# Patient Record
Sex: Male | Born: 1935 | Race: White | Hispanic: No | Marital: Married | Smoking: Never smoker
Health system: Southern US, Community
[De-identification: ages and names within clinical notes are randomized; demographics above are authoritative.]

## PROBLEM LIST (undated history)

## (undated) DIAGNOSIS — I340 Nonrheumatic mitral (valve) insufficiency: Secondary | ICD-10-CM

## (undated) DIAGNOSIS — G43909 Migraine, unspecified, not intractable, without status migrainosus: Secondary | ICD-10-CM

## (undated) DIAGNOSIS — E785 Hyperlipidemia, unspecified: Secondary | ICD-10-CM

## (undated) DIAGNOSIS — R609 Edema, unspecified: Secondary | ICD-10-CM

## (undated) DIAGNOSIS — R600 Localized edema: Secondary | ICD-10-CM

## (undated) DIAGNOSIS — I509 Heart failure, unspecified: Secondary | ICD-10-CM

## (undated) DIAGNOSIS — N289 Disorder of kidney and ureter, unspecified: Secondary | ICD-10-CM

## (undated) DIAGNOSIS — Z9889 Other specified postprocedural states: Secondary | ICD-10-CM

## (undated) DIAGNOSIS — S82141A Displaced bicondylar fracture of right tibia, initial encounter for closed fracture: Secondary | ICD-10-CM

## (undated) DIAGNOSIS — I10 Essential (primary) hypertension: Secondary | ICD-10-CM

## (undated) DIAGNOSIS — I350 Nonrheumatic aortic (valve) stenosis: Secondary | ICD-10-CM

## (undated) DIAGNOSIS — I6521 Occlusion and stenosis of right carotid artery: Secondary | ICD-10-CM

## (undated) DIAGNOSIS — E119 Type 2 diabetes mellitus without complications: Secondary | ICD-10-CM

## (undated) DIAGNOSIS — R0600 Dyspnea, unspecified: Secondary | ICD-10-CM

## (undated) HISTORY — PX: OTHER SURGICAL HISTORY: SHX169

## (undated) HISTORY — PX: CARDIAC SURGERY: SHX584

## (undated) HISTORY — PX: JOINT REPLACEMENT: SHX530

---

## 2008-02-18 ENCOUNTER — Ambulatory Visit: Payer: Self-pay | Admitting: Family Medicine

## 2009-01-29 ENCOUNTER — Ambulatory Visit: Payer: Self-pay | Admitting: Cardiology

## 2009-02-02 ENCOUNTER — Ambulatory Visit: Payer: Self-pay | Admitting: Cardiology

## 2010-04-27 ENCOUNTER — Ambulatory Visit: Payer: Self-pay | Admitting: Vascular Surgery

## 2010-06-22 ENCOUNTER — Ambulatory Visit: Payer: Self-pay | Admitting: Cardiology

## 2010-07-20 ENCOUNTER — Ambulatory Visit: Payer: Self-pay | Admitting: Vascular Surgery

## 2010-07-28 ENCOUNTER — Inpatient Hospital Stay: Payer: Self-pay | Admitting: Vascular Surgery

## 2011-06-19 ENCOUNTER — Emergency Department: Payer: Self-pay | Admitting: Emergency Medicine

## 2011-06-19 LAB — BASIC METABOLIC PANEL
Anion Gap: 9 (ref 7–16)
Calcium, Total: 8.6 mg/dL (ref 8.5–10.1)
Chloride: 103 mmol/L (ref 98–107)
Co2: 28 mmol/L (ref 21–32)
EGFR (African American): >60
EGFR (Non-African Amer.): >60
Osmolality: 278 (ref 275–301)
Potassium: 3.5 mmol/L (ref 3.5–5.1)
Sodium: 140 mmol/L (ref 136–145)

## 2011-06-19 LAB — CBC
HCT: 37.3 % — ABNORMAL LOW (ref 40.0–52.0)
HGB: 12.5 g/dL — ABNORMAL LOW (ref 13.0–18.0)
MCH: 29.5 pg (ref 26.0–34.0)
MCV: 88 fL (ref 80–100)
Platelet: 169 10*3/uL (ref 150–440)
WBC: 7.2 10*3/uL (ref 3.8–10.6)

## 2011-06-19 LAB — TROPONIN I: Troponin-I: <0.02 ng/mL

## 2011-10-27 ENCOUNTER — Ambulatory Visit: Payer: Self-pay | Admitting: Family Medicine

## 2011-11-16 ENCOUNTER — Ambulatory Visit: Payer: Self-pay | Admitting: Surgery

## 2011-11-16 LAB — HEPATIC FUNCTION PANEL A (ARMC)
Albumin: 2.6 g/dL — ABNORMAL LOW (ref 3.4–5.0)
Alkaline Phosphatase: 294 U/L — ABNORMAL HIGH (ref 50–136)
Bilirubin, Direct: 0.1 mg/dL (ref 0.00–0.20)
SGOT(AST): 20 U/L (ref 15–37)
SGPT (ALT): 24 U/L (ref 12–78)

## 2011-11-16 LAB — CBC
HCT: 31.9 % — ABNORMAL LOW (ref 40.0–52.0)
HGB: 10.7 g/dL — ABNORMAL LOW (ref 13.0–18.0)
MCHC: 33.5 g/dL (ref 32.0–36.0)
Platelet: 217 10*3/uL (ref 150–440)
RDW: 15.2 % — ABNORMAL HIGH (ref 11.5–14.5)
WBC: 5 10*3/uL (ref 3.8–10.6)

## 2011-11-23 ENCOUNTER — Ambulatory Visit: Payer: Self-pay | Admitting: Surgery

## 2011-11-25 LAB — PATHOLOGY REPORT

## 2012-10-29 ENCOUNTER — Ambulatory Visit: Payer: Self-pay | Admitting: Family Medicine

## 2012-11-03 LAB — CULTURE, BLOOD (SINGLE)

## 2013-03-07 ENCOUNTER — Ambulatory Visit: Payer: Self-pay | Admitting: Family Medicine

## 2013-05-20 ENCOUNTER — Ambulatory Visit: Payer: Self-pay | Admitting: Vascular Surgery

## 2013-08-25 ENCOUNTER — Ambulatory Visit: Payer: Self-pay | Admitting: Family Medicine

## 2014-07-08 NOTE — Op Note (Signed)
PATIENT NAME:  Lawrence Aguirre, Lawrence Aguirre MR#:  [id number] DATE OF BIRTH:  10/23/1924  DATE OF PROCEDURE:  11/23/2011  PREOPERATIVE DIAGNOSIS: Chronic cholecystitis, cholelithiasis.   POSTOPERATIVE DIAGNOSIS: Chronic cholecystitis, cholelithiasis.  PROCEDURE: Laparoscopic cholecystectomy.   SURGEON: Adella Hare, MD    ANESTHESIA: General.   INDICATION: This 81 year old male had a recent history of severe right upper quadrant pain. He had CT findings of gallstones. He had elevated alkaline phosphatase. He had ERCP and choledocholithotomy which was done in [facility]. Surgery was recommended for definitive treatment.   DESCRIPTION OF PROCEDURE: The patient was placed on the operating table in the supine position under general endotracheal anesthesia. The abdomen was prepared with ChloraPrep and draped in a sterile manner.   A short incision was made in the inferior aspect of the umbilicus and carried down to the deep fascia which was grasped with laryngeal hook and elevated. A Veress needle was inserted, aspirated, and irrigated with a saline solution. Next, the peritoneal cavity was inflated with carbon dioxide. The Veress needle was removed. The 10 mm cannula was inserted. The 10 mm 0 degree laparoscope was inserted to view the peritoneal cavity. The liver appeared normal. Intestines which were viewed appeared typical. Another incision was made in the epigastrium slightly to the right of the midline to introduce an 11 mm cannula. Two incisions were made in the lateral aspect of the right upper quadrant to introduce two 5-mm cannulas.   The gallbladder appeared to have a somewhat thickened wall and was retracted towards the right shoulder. Multiple adhesions were taken down with blunt and sharp dissection. The infundibulum was retracted inferiorly and laterally. The porta hepatis was demonstrated. The neck of the gallbladder was mobilized with incision of the visceral peritoneum. The cystic duct was  dissected free from surrounding structures. The cystic artery was dissected free from surrounding structures. A critical view of safety was demonstrated. An Endoclip was placed across the cystic duct adjacent to the neck of the gallbladder. An incision was made in the cystic duct to introduce a Reddick catheter, however, the catheter would not thread into the cystic duct and, therefore, it was removed and a cholangiogram was not done. The cystic duct was doubly ligated with endoclips and divided. The cystic artery was controlled with Endoclip and divided and another branch of the cystic artery was controlled with Endoclip and divided. The gallbladder was dissected free from the liver with hook and cautery. There were several stones which fell out of the gallbladder and were retrieved with the stone scoop. The gallbladder was further dissected and was completely separated from the liver. The gallbladder was placed into an EndoCatch bag and brought up through the infraumbilical port site where the skin was incised another 6 mm and the fascial defect was incised another 6 mm and the gallbladder with stones was removed and submitted in formalin for routine pathology. The right upper quadrant was further inspected, irrigated, and aspirated. Hemostasis appeared to be intact. The cannulas were removed. Carbon dioxide was allowed to escape from the peritoneal cavity. The fascial defect at the umbilicus was closed with a 0 Vicryl figure-of-eight suture. The skin incisions were closed with interrupted 5-0 chromic subcuticular sutures, benzoin, and Steri-Strips. Dressings were applied with paper tape. The patient tolerated surgery satisfactorily and was then prepared for transfer to the recovery room.   ____________________________ Shela Commons. Renda Rolls, MD jws:drc D: 11/23/2011 10:51:29 ET T: 11/23/2011 11:27:43 ET JOB#: [id number]  cc: Adella Hare, MD, <Dictator> Lawrence Aguirre  Lawrence ArchJ Quincee Gittens MD ELECTRONICALLY SIGNED 11/23/2011 12:16

## 2014-07-28 ENCOUNTER — Other Ambulatory Visit: Payer: Self-pay | Admitting: Family Medicine

## 2014-07-28 DIAGNOSIS — M7989 Other specified soft tissue disorders: Secondary | ICD-10-CM

## 2014-07-28 DIAGNOSIS — M79604 Pain in right leg: Secondary | ICD-10-CM

## 2014-07-29 ENCOUNTER — Ambulatory Visit
Admission: RE | Admit: 2014-07-29 | Discharge: 2014-07-29 | Disposition: A | Discharge status: Home / Self Care | Payer: Medicare Other | Source: Ambulatory Visit | Attending: Family Medicine | Admitting: Family Medicine

## 2014-07-29 DIAGNOSIS — M79661 Pain in right lower leg: Secondary | ICD-10-CM | POA: Insufficient documentation

## 2014-07-29 DIAGNOSIS — M7989 Other specified soft tissue disorders: Secondary | ICD-10-CM

## 2014-07-29 DIAGNOSIS — M79604 Pain in right leg: Secondary | ICD-10-CM

## 2016-02-02 ENCOUNTER — Other Ambulatory Visit: Payer: Self-pay | Admitting: Family Medicine

## 2016-02-02 DIAGNOSIS — N644 Mastodynia: Secondary | ICD-10-CM

## 2016-02-19 ENCOUNTER — Other Ambulatory Visit: Payer: Self-pay | Admitting: Family Medicine

## 2016-02-19 ENCOUNTER — Ambulatory Visit
Admission: RE | Admit: 2016-02-19 | Discharge: 2016-02-19 | Disposition: A | Discharge status: Home / Self Care | Payer: Medicare Other | Source: Ambulatory Visit | Attending: Family Medicine | Admitting: Family Medicine

## 2016-02-19 DIAGNOSIS — N62 Hypertrophy of breast: Secondary | ICD-10-CM | POA: Diagnosis not present

## 2016-02-19 DIAGNOSIS — N644 Mastodynia: Secondary | ICD-10-CM

## 2017-12-14 ENCOUNTER — Ambulatory Visit
Admission: EM | Admit: 2017-12-14 | Discharge: 2017-12-14 | Disposition: A | Discharge status: Other Acute Inpatient Hospital | Payer: Medicare Other | Attending: Internal Medicine | Admitting: Internal Medicine

## 2017-12-14 ENCOUNTER — Ambulatory Visit (INDEPENDENT_AMBULATORY_CARE_PROVIDER_SITE_OTHER): Payer: Medicare Other

## 2017-12-14 DIAGNOSIS — M25572 Pain in left ankle and joints of left foot: Secondary | ICD-10-CM

## 2017-12-14 DIAGNOSIS — W19XXXA Unspecified fall, initial encounter: Secondary | ICD-10-CM | POA: Diagnosis not present

## 2017-12-14 DIAGNOSIS — S82392A Other fracture of lower end of left tibia, initial encounter for closed fracture: Secondary | ICD-10-CM

## 2017-12-14 HISTORY — DX: Essential (primary) hypertension: I10

## 2017-12-14 MED ORDER — ACETAMINOPHEN 325 MG PO TABS
650.0000 mg | ORAL_TABLET | Freq: Once | ORAL | Status: AC
Start: 2017-12-14 — End: 2017-12-14
  Administered 2017-12-14: 650 mg via ORAL

## 2017-12-14 NOTE — ED Notes (Signed)
Pt in treatment room.  In NAD.  Needs address.  Pt/Fam updated on POC.   

## 2017-12-14 NOTE — ED Triage Notes (Signed)
Left ankle s/p fall around 1330. Was outside next to truck. Mechanical fall only.  "legs just gave out".   Left ankle more swollen than right.

## 2017-12-14 NOTE — ED Provider Notes (Addendum)
[facility]    CSN: [id number] Arrival date & time: 12/14/17  1737     History   Chief Complaint Chief Complaint  Patient presents with  . Ankle Pain  . Fall    HPI Vershawn I Meche Montez Hageman. is a 84 y.o. male.   HPI  84 year old male brought in by his son after he fell he was walking next to a truck.  His legs "just gave out".Denies  Loss of consciousness. Falling  has been a problem of his recently.  Had a tibial plateau fracture on the right knee last year.  This time his left ankle has swelling and ecchymosis.  Is unable to walk on the ankle.     Past Medical History:  Diagnosis Date  . Hypertension     There are no active problems to display for this patient.   Past Surgical History:  Procedure Laterality Date  . JOINT REPLACEMENT         Home Medications    Prior to Admission medications   Medication Sig Start Date End Date Taking? Authorizing Provider  lisinopril (PRINIVIL,ZESTRIL) 5 MG tablet Take 5 mg by mouth 2 (two) times daily.   Yes [provider]  tamsulosin (FLOMAX) 0.4 MG CAPS capsule Take 0.4 mg by mouth 2 (two) times daily.   Yes [provider]    Family History History reviewed. No pertinent family history.  Social History Social History   Tobacco Use  . Smoking status: Never Smoker  Substance Use Topics  . Alcohol use: Never    Frequency: Never  . Drug use: Never     Allergies   Iodine; Nylon; Penicillins; Sulfa antibiotics; and Tape   Review of Systems Review of Systems  Constitutional: Positive for activity change. Negative for appetite change, chills, fatigue and fever.  Musculoskeletal: Positive for gait problem and joint swelling.  All other systems reviewed and are negative.    Physical Exam Triage Vital Signs ED Triage Vitals  Enc Vitals Group     BP 12/14/17 1750 (!) 150/84     Pulse Rate 12/14/17 1750 (!) 103     Resp 12/14/17 1750 18     Temp 12/14/17 1750 97.9 F (36.6 C)     Temp  Source 12/14/17 1750 Oral     SpO2 12/14/17 1750 94 %     Weight --      Height --      Head Circumference --      Peak Flow --      Pain Score 12/14/17 1753 6     Pain Loc --      Pain Edu? --      Excl. in [place]? --    No data found.  Updated Vital Signs BP (!) 150/84 (BP Location: Left Arm)   Pulse (!) 103   Temp 97.9 F (36.6 C) (Oral)   Resp 18   SpO2 94%   Visual Acuity Right Eye Distance:   Left Eye Distance:   Bilateral Distance:    Right Eye Near:   Left Eye Near:    Bilateral Near:     Physical Exam  Constitutional: He is oriented to person, place, and time. He appears well-developed and well-nourished. No distress.  HENT:  Head: Normocephalic.  Patient is hard of hearing  Eyes: Pupils are equal, round, and reactive to light. Right eye exhibits no discharge. Left eye exhibits no discharge.  Neck: Normal range of motion.  Musculoskeletal: He exhibits edema and  tenderness.  Of the left lower extremity shows ecchymosis and swelling from approximately the juncture of the distal and mid thirds.  Tenderness is maximal over the distal tibia.  Neurological: He is alert and oriented to person, place, and time.  Skin: Skin is warm and dry. He is not diaphoretic.  Psychiatric: He has a normal mood and affect. His behavior is normal. Judgment and thought content normal.  Nursing note and vitals reviewed.    UC Treatments / Results  Labs (all labs ordered are listed, but only abnormal results are displayed) Labs Reviewed - No data to display  EKG None  Radiology Dg Ankle Complete Left  Result Date: 12/14/2017 CLINICAL DATA:  Fall, left ankle injury, pain EXAM: LEFT ANKLE COMPLETE - 3+ VIEW COMPARISON:  None. FINDINGS: Distal tibial fracture noted posteriorly and medially. No visible fibular fracture. Diffuse soft tissue swelling. IMPRESSION: Distal tibial fracture noted posteriorly and medially. Electronically Signed   By: Charlett Nose M.D.   On: 12/14/2017 18:32     Procedures Procedures (including critical care time)  Medications Ordered in UC Medications  acetaminophen (TYLENOL) tablet 650 mg (650 mg Oral Given 12/14/17 1848)    Initial Impression / Assessment and Plan / UC Course  I have reviewed the triage vital signs and the nursing notes.  Pertinent labs & imaging results that were available during my care of the patient were reviewed by me and considered in my medical decision making (see chart for details).     Because of the patient's age and his his injury to his right knee  Which still has a brace applied, I recommended that they be seen at the emergency room tonight for evaluation by orthopedic.  Placed him in a posterior splint for nonweightbearing.  His son will take him to [facility] privately owned vehicle.  Had his surgery on his tibial plateau fracture at [facility].  Facility in stable condition.  Even Tylenol 650 mg p.o. prior to his departure Final Clinical Impressions(s) / UC Diagnoses   Final diagnoses:  Other closed fracture of distal end of left tibia, initial encounter   Discharge Instructions   None    ED Prescriptions    None     Controlled Substance Prescriptions  Controlled Substance Registry consulted? Not Applicable   Lutricia Feil, PA-C 12/14/17 2058    Lutricia Feil, PA-C 12/14/17 2100

## 2018-01-23 ENCOUNTER — Other Ambulatory Visit: Payer: Self-pay | Admitting: Family Medicine

## 2018-01-23 ENCOUNTER — Encounter
Admission: RE | Admit: 2018-01-23 | Discharge: 2018-01-23 | Disposition: A | Discharge status: Home / Self Care | Payer: Medicare Other | Source: Ambulatory Visit | Attending: Family Medicine | Admitting: Family Medicine

## 2018-01-23 ENCOUNTER — Other Ambulatory Visit
Admission: RE | Admit: 2018-01-23 | Discharge: 2018-01-23 | Disposition: A | Discharge status: Home / Self Care | Payer: Medicare Other | Source: Ambulatory Visit | Attending: Family Medicine | Admitting: Family Medicine

## 2018-01-23 DIAGNOSIS — R0602 Shortness of breath: Secondary | ICD-10-CM | POA: Insufficient documentation

## 2018-01-23 LAB — FIBRIN DERIVATIVES D-DIMER (ARMC ONLY): Fibrin derivatives D-dimer (ARMC): 2037.34 ng/mL (FEU) — ABNORMAL HIGH (ref 0.00–499.00)

## 2018-01-23 MED ORDER — TECHNETIUM TO 99M ALBUMIN AGGREGATED
4.0000 | Freq: Once | INTRAVENOUS | Status: AC | PRN
Start: 2018-01-23 — End: 2018-01-23
  Administered 2018-01-23: 3.21 via INTRAVENOUS

## 2018-01-23 MED ORDER — TECHNETIUM TC 99M DIETHYLENETRIAME-PENTAACETIC ACID
31.5200 | Freq: Once | INTRAVENOUS | Status: AC | PRN
  Administered 2018-01-23: 31.52 via INTRAVENOUS

## 2018-11-28 ENCOUNTER — Other Ambulatory Visit: Payer: Self-pay | Admitting: Gerontology

## 2018-11-28 ENCOUNTER — Other Ambulatory Visit: Payer: Self-pay

## 2018-11-28 ENCOUNTER — Ambulatory Visit
Admission: RE | Admit: 2018-11-28 | Discharge: 2018-11-28 | Disposition: A | Discharge status: Home / Self Care | Payer: Medicare Other | Source: Ambulatory Visit | Attending: Gerontology | Admitting: Gerontology

## 2018-11-28 DIAGNOSIS — R609 Edema, unspecified: Secondary | ICD-10-CM

## 2018-12-15 ENCOUNTER — Encounter: Payer: Self-pay | Admitting: Emergency Medicine

## 2018-12-15 ENCOUNTER — Ambulatory Visit (INDEPENDENT_AMBULATORY_CARE_PROVIDER_SITE_OTHER)
Admission: EM | Admit: 2018-12-15 | Discharge: 2018-12-15 | Disposition: A | Discharge status: Home / Self Care | Payer: Medicare Other | Source: Home / Self Care | Attending: Family Medicine | Admitting: Family Medicine

## 2018-12-15 ENCOUNTER — Emergency Department: Payer: Medicare Other

## 2018-12-15 ENCOUNTER — Other Ambulatory Visit: Payer: Self-pay

## 2018-12-15 ENCOUNTER — Inpatient Hospital Stay
Admission: EM | Admit: 2018-12-15 | Discharge: 2018-12-18 | DRG: 291 | Disposition: A | Discharge status: Home / Self Care | Payer: Medicare Other | Attending: Internal Medicine | Admitting: Internal Medicine

## 2018-12-15 ENCOUNTER — Encounter: Payer: Self-pay | Admitting: Gynecology

## 2018-12-15 DIAGNOSIS — I5043 Acute on chronic combined systolic (congestive) and diastolic (congestive) heart failure: Secondary | ICD-10-CM | POA: Diagnosis present

## 2018-12-15 DIAGNOSIS — E876 Hypokalemia: Secondary | ICD-10-CM | POA: Diagnosis present

## 2018-12-15 DIAGNOSIS — R600 Localized edema: Secondary | ICD-10-CM

## 2018-12-15 DIAGNOSIS — Z79899 Other long term (current) drug therapy: Secondary | ICD-10-CM | POA: Diagnosis not present

## 2018-12-15 DIAGNOSIS — I080 Rheumatic disorders of both mitral and aortic valves: Secondary | ICD-10-CM | POA: Diagnosis present

## 2018-12-15 DIAGNOSIS — Z91048 Other nonmedicinal substance allergy status: Secondary | ICD-10-CM

## 2018-12-15 DIAGNOSIS — Z96642 Presence of left artificial hip joint: Secondary | ICD-10-CM | POA: Diagnosis present

## 2018-12-15 DIAGNOSIS — I959 Hypotension, unspecified: Secondary | ICD-10-CM | POA: Diagnosis present

## 2018-12-15 DIAGNOSIS — I5023 Acute on chronic systolic (congestive) heart failure: Secondary | ICD-10-CM | POA: Diagnosis present

## 2018-12-15 DIAGNOSIS — I130 Hypertensive heart and chronic kidney disease with heart failure and stage 1 through stage 4 chronic kidney disease, or unspecified chronic kidney disease: Secondary | ICD-10-CM | POA: Diagnosis present

## 2018-12-15 DIAGNOSIS — N400 Enlarged prostate without lower urinary tract symptoms: Secondary | ICD-10-CM | POA: Diagnosis present

## 2018-12-15 DIAGNOSIS — Z950 Presence of cardiac pacemaker: Secondary | ICD-10-CM | POA: Diagnosis not present

## 2018-12-15 DIAGNOSIS — E785 Hyperlipidemia, unspecified: Secondary | ICD-10-CM | POA: Diagnosis present

## 2018-12-15 DIAGNOSIS — I248 Other forms of acute ischemic heart disease: Secondary | ICD-10-CM | POA: Diagnosis present

## 2018-12-15 DIAGNOSIS — I4891 Unspecified atrial fibrillation: Secondary | ICD-10-CM | POA: Diagnosis present

## 2018-12-15 DIAGNOSIS — Z888 Allergy status to other drugs, medicaments and biological substances status: Secondary | ICD-10-CM | POA: Diagnosis not present

## 2018-12-15 DIAGNOSIS — Z66 Do not resuscitate: Secondary | ICD-10-CM | POA: Diagnosis present

## 2018-12-15 DIAGNOSIS — R778 Other specified abnormalities of plasma proteins: Secondary | ICD-10-CM

## 2018-12-15 DIAGNOSIS — E871 Hypo-osmolality and hyponatremia: Secondary | ICD-10-CM | POA: Diagnosis not present

## 2018-12-15 DIAGNOSIS — I509 Heart failure, unspecified: Secondary | ICD-10-CM

## 2018-12-15 DIAGNOSIS — E1122 Type 2 diabetes mellitus with diabetic chronic kidney disease: Secondary | ICD-10-CM | POA: Diagnosis present

## 2018-12-15 DIAGNOSIS — I2510 Atherosclerotic heart disease of native coronary artery without angina pectoris: Secondary | ICD-10-CM | POA: Diagnosis present

## 2018-12-15 DIAGNOSIS — Z20828 Contact with and (suspected) exposure to other viral communicable diseases: Secondary | ICD-10-CM | POA: Diagnosis present

## 2018-12-15 DIAGNOSIS — Z880 Allergy status to penicillin: Secondary | ICD-10-CM

## 2018-12-15 DIAGNOSIS — Z7982 Long term (current) use of aspirin: Secondary | ICD-10-CM

## 2018-12-15 DIAGNOSIS — R0602 Shortness of breath: Secondary | ICD-10-CM

## 2018-12-15 DIAGNOSIS — Z882 Allergy status to sulfonamides status: Secondary | ICD-10-CM

## 2018-12-15 HISTORY — DX: Edema, unspecified: R60.9

## 2018-12-15 HISTORY — DX: Occlusion and stenosis of right carotid artery: I65.21

## 2018-12-15 HISTORY — DX: Type 2 diabetes mellitus without complications: E11.9

## 2018-12-15 HISTORY — DX: Other specified postprocedural states: Z98.890

## 2018-12-15 HISTORY — DX: Hyperlipidemia, unspecified: E78.5

## 2018-12-15 HISTORY — DX: Nonrheumatic aortic (valve) stenosis: I35.0

## 2018-12-15 HISTORY — DX: Heart failure, unspecified: I50.9

## 2018-12-15 HISTORY — DX: Migraine, unspecified, not intractable, without status migrainosus: G43.909

## 2018-12-15 HISTORY — DX: Localized edema: R60.0

## 2018-12-15 HISTORY — DX: Displaced bicondylar fracture of right tibia, initial encounter for closed fracture: S82.141A

## 2018-12-15 HISTORY — DX: Dyspnea, unspecified: R06.00

## 2018-12-15 HISTORY — DX: Nonrheumatic mitral (valve) insufficiency: I34.0

## 2018-12-15 HISTORY — DX: Disorder of kidney and ureter, unspecified: N28.9

## 2018-12-15 LAB — CBC WITH DIFFERENTIAL/PLATELET
Abs Immature Granulocytes: 0.02 10*3/uL (ref 0.00–0.07)
Basophils Absolute: 0 10*3/uL (ref 0.0–0.1)
Basophils Relative: 1 %
Eosinophils Absolute: 0.1 10*3/uL (ref 0.0–0.5)
Eosinophils Relative: 2 %
HCT: 38.1 % — ABNORMAL LOW (ref 39.0–52.0)
Hemoglobin: 12.6 g/dL — ABNORMAL LOW (ref 13.0–17.0)
Immature Granulocytes: 0 %
Lymphocytes Relative: 12 %
Lymphs Abs: 0.9 10*3/uL (ref 0.7–4.0)
MCH: 29.6 pg (ref 26.0–34.0)
MCHC: 33.1 g/dL (ref 30.0–36.0)
MCV: 89.6 fL (ref 80.0–100.0)
Monocytes Absolute: 0.8 10*3/uL (ref 0.1–1.0)
Monocytes Relative: 10 %
Neutro Abs: 5.4 10*3/uL (ref 1.7–7.7)
Neutrophils Relative %: 75 %
Platelets: 182 10*3/uL (ref 150–400)
RBC: 4.25 MIL/uL (ref 4.22–5.81)
RDW: 14.5 % (ref 11.5–15.5)
WBC: 7.3 10*3/uL (ref 4.0–10.5)
nRBC: 0 % (ref 0.0–0.2)

## 2018-12-15 LAB — COMPREHENSIVE METABOLIC PANEL
ALT: 13 U/L (ref 0–44)
AST: 22 U/L (ref 15–41)
Albumin: 3.9 g/dL (ref 3.5–5.0)
Alkaline Phosphatase: 77 U/L (ref 38–126)
Anion gap: 13 (ref 5–15)
BUN: 21 mg/dL (ref 8–23)
CO2: 26 mmol/L (ref 22–32)
Calcium: 8.9 mg/dL (ref 8.9–10.3)
Chloride: 92 mmol/L — ABNORMAL LOW (ref 98–111)
Creatinine, Ser: 1.46 mg/dL — ABNORMAL HIGH (ref 0.61–1.24)
GFR calc Af Amer: 48 mL/min — ABNORMAL LOW (ref >60)
GFR calc non Af Amer: 41 mL/min — ABNORMAL LOW (ref >60)
Glucose, Bld: 107 mg/dL — ABNORMAL HIGH (ref 70–99)
Potassium: 2.7 mmol/L — CL (ref 3.5–5.1)
Sodium: 131 mmol/L — ABNORMAL LOW (ref 135–145)
Total Bilirubin: 0.9 mg/dL (ref 0.3–1.2)
Total Protein: 7 g/dL (ref 6.5–8.1)

## 2018-12-15 LAB — SARS CORONAVIRUS 2 BY RT PCR (HOSPITAL ORDER, PERFORMED IN ~~LOC~~ HOSPITAL LAB): SARS Coronavirus 2: NEGATIVE

## 2018-12-15 LAB — TROPONIN I (HIGH SENSITIVITY)
Troponin I (High Sensitivity): 87 ng/L — ABNORMAL HIGH (ref <18)
Troponin I (High Sensitivity): 105 ng/L (ref <18)
Troponin I (High Sensitivity): 116 ng/L (ref <18)
Troponin I (High Sensitivity): 114 ng/L (ref <18)

## 2018-12-15 LAB — LACTIC ACID, PLASMA
Lactic Acid, Venous: 2.2 mmol/L (ref 0.5–1.9)
Lactic Acid, Venous: 1.9 mmol/L (ref 0.5–1.9)

## 2018-12-15 LAB — BRAIN NATRIURETIC PEPTIDE: B Natriuretic Peptide: 2528 pg/mL — ABNORMAL HIGH (ref 0.0–100.0)

## 2018-12-15 LAB — MAGNESIUM: Magnesium: 1.9 mg/dL (ref 1.7–2.4)

## 2018-12-15 MED ORDER — POTASSIUM CHLORIDE CRYS ER 20 MEQ PO TBCR
20.0000 meq | EXTENDED_RELEASE_TABLET | Freq: Two times a day (BID) | ORAL | Status: DC
  Administered 2018-12-15 – 2018-12-18 (×6): 20 meq via ORAL
  Filled 2018-12-15 (×6): qty 1

## 2018-12-15 MED ORDER — FUROSEMIDE 10 MG/ML IJ SOLN
20.0000 mg | Freq: Three times a day (TID) | INTRAMUSCULAR | Status: DC
  Administered 2018-12-15 – 2018-12-16 (×3): 20 mg via INTRAVENOUS
  Filled 2018-12-15 (×3): qty 2

## 2018-12-15 MED ORDER — GUAIFENESIN-DM 100-10 MG/5ML PO SYRP: 5.0000 mL | ORAL_SOLUTION | ORAL | Status: DC | PRN

## 2018-12-15 MED ORDER — FINASTERIDE 5 MG PO TABS
5.0000 mg | ORAL_TABLET | Freq: Every day | ORAL | Status: DC
  Administered 2018-12-16 – 2018-12-18 (×3): 5 mg via ORAL
  Filled 2018-12-15 (×3): qty 1

## 2018-12-15 MED ORDER — SODIUM CHLORIDE 0.9% FLUSH
3.0000 mL | Freq: Two times a day (BID) | INTRAVENOUS | Status: DC
  Administered 2018-12-15 – 2018-12-17 (×5): 3 mL via INTRAVENOUS

## 2018-12-15 MED ORDER — INFLUENZA VAC A&B SA ADJ QUAD 0.5 ML IM PRSY
0.5000 mL | PREFILLED_SYRINGE | INTRAMUSCULAR | Status: DC
  Filled 2018-12-15: qty 0.5

## 2018-12-15 MED ORDER — MAGNESIUM SULFATE IN D5W 1-5 GM/100ML-% IV SOLN
1.0000 g | Freq: Once | INTRAVENOUS | Status: AC
  Administered 2018-12-15: 14:00:00 1 g via INTRAVENOUS
  Filled 2018-12-15: qty 100

## 2018-12-15 MED ORDER — SODIUM CHLORIDE 0.9% FLUSH: 3.0000 mL | Freq: Once | INTRAVENOUS | Status: DC

## 2018-12-15 MED ORDER — SODIUM CHLORIDE 0.9 % IV SOLN: 250.0000 mL | INTRAVENOUS | Status: DC | PRN

## 2018-12-15 MED ORDER — POTASSIUM CHLORIDE 20 MEQ/15ML (10%) PO SOLN
40.0000 meq | Freq: Once | ORAL | Status: AC
  Administered 2018-12-15: 15:00:00 40 meq via ORAL
  Filled 2018-12-15: qty 30

## 2018-12-15 MED ORDER — ONDANSETRON HCL 4 MG/2ML IJ SOLN: 4.0000 mg | Freq: Four times a day (QID) | INTRAMUSCULAR | Status: DC | PRN

## 2018-12-15 MED ORDER — MAGNESIUM SULFATE 2 GM/50ML IV SOLN
INTRAVENOUS | Status: AC
  Filled 2018-12-15: qty 50

## 2018-12-15 MED ORDER — ASPIRIN EC 81 MG PO TBEC: 81.0000 mg | DELAYED_RELEASE_TABLET | Freq: Every day | ORAL | Status: DC

## 2018-12-15 MED ORDER — ASPIRIN EC 81 MG PO TBEC
81.0000 mg | DELAYED_RELEASE_TABLET | Freq: Every day | ORAL | Status: DC
  Administered 2018-12-16 – 2018-12-18 (×3): 81 mg via ORAL
  Filled 2018-12-15 (×3): qty 1

## 2018-12-15 MED ORDER — HEPARIN SODIUM (PORCINE) 5000 UNIT/ML IJ SOLN
5000.0000 [IU] | Freq: Three times a day (TID) | INTRAMUSCULAR | Status: DC
  Administered 2018-12-15 – 2018-12-18 (×9): 5000 [IU] via SUBCUTANEOUS
  Filled 2018-12-15 (×8): qty 1

## 2018-12-15 MED ORDER — IPRATROPIUM-ALBUTEROL 0.5-2.5 (3) MG/3ML IN SOLN: 3.0000 mL | Freq: Four times a day (QID) | RESPIRATORY_TRACT | Status: DC | PRN

## 2018-12-15 MED ORDER — SODIUM CHLORIDE 0.9% FLUSH: 3.0000 mL | INTRAVENOUS | Status: DC | PRN

## 2018-12-15 MED ORDER — TAMSULOSIN HCL 0.4 MG PO CAPS
0.8000 mg | ORAL_CAPSULE | Freq: Every day | ORAL | Status: DC
  Administered 2018-12-15 – 2018-12-17 (×3): 0.8 mg via ORAL
  Filled 2018-12-15 (×3): qty 2

## 2018-12-15 MED ORDER — ACETAMINOPHEN 325 MG PO TABS: 650.0000 mg | ORAL_TABLET | ORAL | Status: DC | PRN

## 2018-12-15 NOTE — ED Notes (Signed)
Repeat troponin and lactic were drawn and sent to lab.

## 2018-12-15 NOTE — ED Triage Notes (Signed)
Patient presents to the ED with increased shortness of breath and increased swelling and weakness.  Patient also reports not urinating.  Patient was seen by PCP on Thursday and put on spironolactone and his lasix was increased.  Patient reports deterioration since that time.

## 2018-12-15 NOTE — ED Provider Notes (Signed)
[facility] ____________________________________________  Time seen: Approximately 11:10 AM  I have reviewed the triage vital signs and the nursing notes.   HISTORY  Chief Complaint Shortness of Breath   HPI Lawrence I Fonte Montez Hageman. is a 85 y.o. male history of diabetes, hypertension, hyperlipidemia, CHF, chronic peripheral edema, and chronic exertional dyspnea, and CAD presenting for further evaluation of worsening dyspnea.  Patient presenting with son.  Patient was seen by primary office 2 days ago for similar complaint and it was noted he was having increased dyspnea with difficulty ambulating short distances.  At that time primary office did order chest x-ray, unable to see result, as well as labs with noted elevated BNP and hypokalemia.  Primary increased spironolactone and called in potassium prescription.  Presenting today for continued complaints.  Patient and son report 2 weeks ago patient was ambulating with a rolling walker at home but currently unable to walk through home.  States he has not had any improvement at home with medication changes for the last few days as well as with nebulizer treatments.  Decreased urinary output today.  Continues to worsen with fatigue and shortness of breath.  No chest pain or fevers.  Occasional cough.  No known sick contacts.  Marina Goodell, MD: PCP    Past Medical History:  Diagnosis Date   Aortic stenosis    Diabetes mellitus without complication (HCC)    Dyspnea    Fracture of right tibial plateau    H/O cardiac catheterization    Hyperlipemia    Hypertension    Kidney disease    Migraine headache    Mitral regurgitation    Peripheral edema    Stenosis of right carotid artery   via care everywhere Dyspnea 11/28/2018  Last Assessment & Plan:   Pt having worsening dyspnea. Unable to ambulate minimal distance without experiencing dyspnea/ having to rest. This is worse since last assessment. Son reports having  an O2 tank at home that had belonged to pt's wife. Son has put O2 on the pt recently with pt reporting feeling better after applying the O2. Pt was unable to walk with the CMA today to measure ambulatory O2 sats d/t fatigue. Will try again at next visit. Reports some chest discomfort when dyspneic. Son reports the other day, pt did not put on his socks or shoes all day d/t feeling of dyspnea when he would try to lean over. Pt also reports feeling a "lump" in his throat over the past week. Reports having blood tinged sputum expectorated after cough. No fevers.    Chronic systolic CHF (congestive heart failure), NYHA class 2 02/20/2018  Peripheral edema 01/30/2018  Last Assessment & Plan:   Pt continues to have 2-3+ BLE pitting edema. Skin is shiny, boggy with fluid. Very defined sock ring with socks that are not tight. Increased RLE redness. Edema in legs R>L. Calves soft, supple. Pt called cardiology last week about the edema. Was told to take 2 diuretic pills for several days. Pt reports this did make him feel better. Pt reports he is voiding better. No longer having to sit and wait for a long time to void.    Fracture of right tibial plateau 12/19/2016  History of permanent cardiac pacemaker placement 12/19/2016  Fracture of right tibial plateau 12/19/2016  History of permanent cardiac pacemaker placement 12/19/2016  Aortic stenosis 06/21/2016  Overview:   severe with aortic valve area 0.68 cm, echo 08/07/2017   Type 2 diabetes mellitus with nephropathy 03/27/2015  Essential hypertension 03/27/2015  Pure hypercholesterolemia 03/27/2015  Benign non-nodular prostatic hyperplasia with lower urinary tract symptoms 03/27/2015  Last Assessment & Plan:   Pt is already on 0.8 mg Flomax. PSA has not been checked in a while, will check today. Pt reports over he past week or so, he has a difficulty time initiating a urine stream. Says he has to sit on the toilet for a few minutes before he can start to  void. Once he is able to start the flow, he feels he empties his bladder. No pain, no burning with urination. No hematuria. Pt is getting up on average about 3 times per night to void. This is normal to him. Says it used to be more often until he was started on the Flomax.    H/O cardiac catheterization 11/05/2013  Overview:   Insignificant CAD 06/22/10 with 50% stenosis first obtuse marginal branch   Mitral regurgitation 11/05/2013  Overview:   Moderate mitral regurgitation And tricuspid regurgitation   Stenosis of right carotid artery 11/05/2013  Kidney disease 11/05/2013  Overview:   chronic   Hypotension 11/05/2013  BPH (benign prostatic hyperplasia)   Hypertension   Hyperlipidemia   Diabetes mellitus type 2, uncomplicated   Migraine headache      There are no active problems to display for this patient.   Past Surgical History:  Procedure Laterality Date   cardiac peacemaker placement     CARDIAC SURGERY     JOINT REPLACEMENT     left hip replacement       No current facility-administered medications for this encounter.   Current Outpatient Medications:    amoxicillin-clavulanate (AUGMENTIN) 875-125 MG tablet, Take by mouth., Disp: , Rfl:    aspirin EC 81 MG tablet, Take by mouth., Disp: , Rfl:    cefdinir (OMNICEF) 300 MG capsule, , Disp: , Rfl:    diphenhydrAMINE (BENADRYL) 25 mg capsule, Take by mouth., Disp: , Rfl:    finasteride (PROSCAR) 5 MG tablet, Take by mouth., Disp: , Rfl:    furosemide (LASIX) 40 MG tablet, Take by mouth., Disp: , Rfl:    ipratropium-albuterol (DUONEB) 0.5-2.5 (3) MG/3ML SOLN, , Disp: , Rfl:    ipratropium-albuterol (DUONEB) 0.5-2.5 (3) MG/3ML SOLN, Inhale into the lungs., Disp: , Rfl:    lisinopril (PRINIVIL,ZESTRIL) 5 MG tablet, Take 5 mg by mouth 2 (two) times daily., Disp: , Rfl:    spironolactone (ALDACTONE) 50 MG tablet, , Disp: , Rfl:    tamsulosin (FLOMAX) 0.4 MG CAPS capsule, Take 0.4 mg by mouth 2  (two) times daily., Disp: , Rfl:   Allergies Iodine, Nylon, Penicillins, Sulfa antibiotics, and Tape  History reviewed. No pertinent family history.  Social History Social History   Tobacco Use   Smoking status: Never Smoker   Smokeless tobacco: Never Used  Substance Use Topics   Alcohol use: Never    Frequency: Never   Drug use: Never    Review of Systems Constitutional: No fever ENT: No sore throat. Cardiovascular: Denies chest pain. Respiratory: Positive shortness of breath. Gastrointestinal: No abdominal pain.  Genitourinary: Negative for dysuria. Musculoskeletal: Negative for back pain. Skin: Negative for rash.  ____________________________________________   PHYSICAL EXAM:  VITAL SIGNS: ED Triage Vitals  Enc Vitals Group     BP      Pulse      Resp      Temp      Temp src      SpO2      Weight  Height      Head Circumference      Peak Flow      Pain Score      Pain Loc      Pain Edu?      Excl. in [place]?    Vitals:   12/15/18 1112 12/15/18 1113 12/15/18 1132  BP: (!) 83/62    Pulse: 60    Resp: 16    Temp:   (!) 94.7 F (34.8 C)  TempSrc:   Temporal  SpO2: 98%    Weight:  199 lb (90.3 kg)   Height:  6' (1.829 m)      Constitutional: Alert and oriented.  Eyes: Conjunctivae are normal.  ENT      Head: Normocephalic and atraumatic. Cardiovascular: Normal rate, regular rhythm. Grossly normal heart sounds.   Respiratory: Shallow respirations.  Mildly diminished breath sounds but overall good air movement. Musculoskeletal:  2-3+ bilateral lower extremity pitting edema. Neurologic:  Normal speech and language. Skin:  Skin is warm, dry and intact. No rash noted. Psychiatric: Mood and affect are normal. Speech and behavior are normal. Patient exhibits appropriate insight and judgment   ___________________________________________   LABS (all labs ordered are listed, but only abnormal results are displayed)  Labs Reviewed  NOVEL  CORONAVIRUS, NAA (HOSP ORDER, SEND-OUT TO REF LAB; TAT 18-24 HRS)   ____________________________________________  EKG  ED ECG REPORT I, Marylene Land, the attending provider, personally viewed and interpreted this ECG.   Date: 12/15/2018  EKG Time: 1156  Rate: 64  Rhythm: Paced  ST&T Change:   RADIOLOGY  No results found. ____________________________________________   PROCEDURES Procedures    INITIAL IMPRESSION / ASSESSMENT AND PLAN / ED COURSE  Pertinent labs & imaging results that were available during my care of the patient were reviewed by me and considered in my medical decision making (see chart for details).  Pleasant patient presenting with son at bedside for worsening dyspnea.  Patient appears to be fluid overloaded with acute on chronic CHF and noted to have hypotension and decreased fluid output today.  Recommend further evaluation and treatment in emergency room.  Patient and son declined EMS transfer, verbalized understanding and states that son will take him directly to [facility].  Maudie Mercury RN triage nurse called and given report. ____________________________________________   FINAL CLINICAL IMPRESSION(S) / ED DIAGNOSES  Final diagnoses:  Acute on chronic congestive heart failure, unspecified heart failure type ([place])  Hypotension, unspecified hypotension type  Shortness of breath     ED Discharge Orders    None       Note: This dictation was prepared with Dragon dictation along with smaller phrase technology. Any transcriptional errors that result from this process are unintentional.         Marylene Land, NP 12/15/18 1216

## 2018-12-15 NOTE — ED Notes (Signed)
Pt had possible run of V-tach on monitor, Dr. Cinda Quest in room, pt placed on Zoll pads, repeat EKG completed by [facility] RN.

## 2018-12-15 NOTE — ED Notes (Signed)
Per [employer] report from MD, pt has had no ventricular arrhythmias since July, pt has had several episodes of atrial arrhythmias today. Per report pt's pacemaker is working.

## 2018-12-15 NOTE — Discharge Instructions (Addendum)
Go directly to the emergency room now. 

## 2018-12-15 NOTE — Progress Notes (Signed)
MD notified of 3rd troponin of 115. RN will await any new orders. I will continue to assess.

## 2018-12-15 NOTE — ED Provider Notes (Signed)
[facility] Emergency Department Provider Note   ____________________________________________   First MD Initiated Contact with Patient 12/15/18 1242     (approximate)  I have reviewed the triage vital signs and the nursing notes.   HISTORY  Chief Complaint Leg Swelling and Weakness    HPI Lawrence Stjames. is a 85 y.o. male who has been having increasing leg swelling and shortness of breath walking for some time.  His doctors increased his Lasix and given him Spironolactone but he has not had any improvement.  Patient has not urinated today.  In the emergency room patient is breathing fast but has oxygen saturations in 97% laying on the bed.  These dropped to 94 when he sits up and when he stands up and takes 3 steps forward in 3 steps back they dropped down to 88 and continue dropping after he sits still to get to 86.  They gradually recover while he rests.  Patient is not having any chest pain or other complaints.  He has been having chest pain intermittently for about 3 weeks ago.  As near as I can tell with exertion.         Past Medical History:  Diagnosis Date  . Aortic stenosis   . Diabetes mellitus without complication ([place])   . Dyspnea   . Fracture of right tibial plateau   . H/O cardiac catheterization   . Hyperlipemia   . Hypertension   . Kidney disease   . Migraine headache   . Mitral regurgitation   . Peripheral edema   . Stenosis of right carotid artery     There are no active problems to display for this patient.   Past Surgical History:  Procedure Laterality Date  . cardiac peacemaker placement    . CARDIAC SURGERY    . JOINT REPLACEMENT    . left hip replacement      Prior to Admission medications   Medication Sig Start Date End Date Taking? Authorizing Provider  amoxicillin-clavulanate (AUGMENTIN) 875-125 MG tablet Take 1 tablet by mouth 2 (two) times daily.  12/13/18 12/23/18 Yes [provider]  aspirin EC 81  MG tablet Take 81 mg by mouth daily.    Yes [provider]  diphenhydrAMINE (BENADRYL) 25 mg capsule Take 25 mg by mouth daily.    Yes [provider]  finasteride (PROSCAR) 5 MG tablet Take 5 mg by mouth daily.  11/28/18 11/28/19 Yes [provider]  furosemide (LASIX) 40 MG tablet Take 80 mg by mouth daily.  11/28/18  Yes [provider]  ipratropium-albuterol (DUONEB) 0.5-2.5 (3) MG/3ML SOLN Inhale 3 mLs into the lungs every 6 (six) hours as needed.  12/13/18  Yes [provider]  lisinopril (PRINIVIL,ZESTRIL) 5 MG tablet Take 5 mg by mouth 2 (two) times daily.   Yes [provider]  spironolactone (ALDACTONE) 50 MG tablet Take 50 mg by mouth daily.  12/13/18  Yes [provider]  tamsulosin (FLOMAX) 0.4 MG CAPS capsule Take 0.8 mg by mouth at bedtime.    Yes [provider]    Allergies Iodine, Nylon, Penicillins, Sulfa antibiotics, and Tape  No family history on file.  Social History Social History   Tobacco Use  . Smoking status: Never Smoker  . Smokeless tobacco: Never Used  Substance Use Topics  . Alcohol use: Never    Frequency: Never  . Drug use: Never    Review of Systems  Constitutional: No fever/chills Eyes: No  visual changes. ENT: No sore throat. Cardiovascular: Denies chest pain. Respiratory: shortness of breath. Gastrointestinal: No abdominal pain.  No nausea, no vomiting.  No diarrhea.  No constipation. Genitourinary: Negative for dysuria. Musculoskeletal: Negative for back pain. Skin: Negative for rash. Neurological: Negative for headaches, focal weakness   ____________________________________________   PHYSICAL EXAM:  VITAL SIGNS: ED Triage Vitals  Enc Vitals Group     BP 12/15/18 1242 120/82     Pulse Rate 12/15/18 1242 93     Resp 12/15/18 1242 (!) 30     Temp 12/15/18 1242 97.6 F (36.4 C)     Temp Source 12/15/18 1242 Oral     SpO2 12/15/18 1242 91 %     Weight 12/15/18 1243  199 lb (90.3 kg)     Height 12/15/18 1243 6' (1.829 m)     Head Circumference --      Peak Flow --      Pain Score 12/15/18 1242 0     Pain Loc --      Pain Edu? --      Excl. in GC? --         Constitutional: Alert and oriented. Well appearing and in no acute distress. Eyes: Conjunctivae are normal. PERRL. EOMI. Head: Atraumatic. Nose: No congestion/rhinnorhea. Mouth/Throat: Mucous membranes are moist.  Oropharynx non-erythematous. Neck: No stridor.   Cardiovascular: Normal rate, regular rhythm. Grossly normal heart sounds.  Good peripheral circulation. Respiratory: Normal respiratory effort.  No retractions. Lungs CTAB! Gastrointestinal: Soft and nontender. No distention. No abdominal bruits. No CVA tenderness. Musculoskeletal: No lower extremity tenderness 2+ bilateral edema.   Neurologic:  Normal speech and language. No gross focal neurologic deficits are appreciated. No gait instability. Skin:  Skin is warm, dry and intact. No rash noted. Psychiatric: Mood and affect are normal. Speech and behavior are normal.  ____________________________________________   LABS (all labs ordered are listed, but only abnormal results are displayed)  Labs Reviewed  COMPREHENSIVE METABOLIC PANEL - Abnormal; Notable for the following components:      Result Value   Sodium 131 (*)    Potassium 2.7 (*)    Chloride 92 (*)    Glucose, Bld 107 (*)    Creatinine, Ser 1.46 (*)    GFR calc non Af Amer 41 (*)    GFR calc Af Amer 48 (*)    All other components within normal limits  BRAIN NATRIURETIC PEPTIDE - Abnormal; Notable for the following components:   B Natriuretic Peptide 2,528.0 (*)    All other components within normal limits  LACTIC ACID, PLASMA - Abnormal; Notable for the following components:   Lactic Acid, Venous 2.2 (*)    All other components within normal limits  CBC WITH DIFFERENTIAL/PLATELET - Abnormal; Notable for the following components:   Hemoglobin 12.6 (*)    HCT 38.1  (*)    All other components within normal limits  TROPONIN I (HIGH SENSITIVITY) - Abnormal; Notable for the following components:   Troponin I (High Sensitivity) 87 (*)    All other components within normal limits  SARS CORONAVIRUS 2 (HOSPITAL ORDER, PERFORMED IN [facility])  LACTIC ACID, PLASMA  URINALYSIS, COMPLETE (UACMP) WITH MICROSCOPIC  MAGNESIUM   ____________________________________________  EKG  EKG read interpreted by me shows sinus rhythm with some PVCs.  Reading wandering atrial pacemaker which is probably true rate of 92 left axis left bundle branch block patient does have a pacemaker but it is not apparently firing currently ____________________________________________  RADIOLOGY  ED MD interpretation  Official radiology report(s): Dg Chest Portable 1 View  Result Date: 12/15/2018 CLINICAL DATA:  85 year old male with a history of shortness of breath EXAM: PORTABLE CHEST 1 VIEW COMPARISON:  June 19, 2011 FINDINGS: Cardiomediastinal silhouette unchanged in size and contour. Low lung volumes with coarsened interstitial markings and interlobular septal thickening. Thickening of the minor fissure. Blunting of the bilateral costophrenic angles.  No pneumothorax. Unchanged position of the left chest wall pacing device. IMPRESSION: Evidence of pulmonary edema and bilateral pleural effusions, with atypical infection not excluded. Electronically Signed   By: Gilmer Mor D.O.   On: 12/15/2018 13:52    ____________________________________________   PROCEDURES  Procedure(s) performed (including Critical Care):  Procedures   ____________________________________________   INITIAL IMPRESSION / ASSESSMENT AND PLAN / ED COURSE Jonny Ruiz I Schrade Jr. was evaluated in Emergency Department on 12/15/2018 for the symptoms described in the history of present illness. He was evaluated in the context of the global COVID-19 pandemic, which necessitated consideration that the  patient might be at risk for infection with the SARS-CoV-2 virus that causes COVID-19. Institutional protocols and algorithms that pertain to the evaluation of patients at risk for COVID-19 are in a state of rapid change based on information released by regulatory bodies including the CDC and federal and state organizations. These policies and algorithms were followed during the patient's care in the ED.     Patient with hyperkalemia CHF EKG with irregular rhythm but P waves are present.  Does not look like atrial flutter it does not look like A. fib which is what the computer thinks it is almost looks like a fast third-degree block.  He does have intermittent episodes of tachycardia up to 102 that were captured on EKG and 115 on the monitor.       ____________________________________________   FINAL CLINICAL IMPRESSION(S) / ED DIAGNOSES  Final diagnoses:  Bilateral lower extremity edema  Hypokalemia  Congestive heart failure, unspecified HF chronicity, unspecified heart failure type (HCC)  Elevated troponin     ED Discharge Orders    None       Note:  This document was prepared using Dragon voice recognition software and may include unintentional dictation errors.    Arnaldo Natal, MD 12/15/18 [phone]

## 2018-12-15 NOTE — H&P (Signed)
[facility] [place] [facility]   PATIENT NAME: Lawrence FannyJohn Raney    MR#:  [id number]  DATE OF BIRTH:  1924-10-24  DATE OF ADMISSION:  12/15/2018  PRIMARY CARE PHYSICIAN: Marina GoodellFeldpausch, Dale E, MD   REQUESTING/REFERRING PHYSICIAN: Darnelle CatalanMalinda  CHIEF COMPLAINT:   Chief Complaint  Patient presents with  . Leg Swelling  . Weakness    HISTORY OF PRESENT ILLNESS: Lawrence Aguirre  is a 85 y.o. male with a known history of aortic stenosis, congestive heart failure with ejection fraction of 35%, diabetes without complication, dyspnea, hyperlipidemia, hypertension, mitral regurgitation, carotid artery stenosis, status post pacemaker placement-has been having feeling of arrhythmia for last few weeks.  He has intermittent episodes of chest tightness along with shortness of breath with exertion and increasing edema on the legs.  He called PMD and cardiologist office who had suggested to increase the dose of Lasix which he did but still did not get relief so he decided to come to emergency room.  At his baseline he walks with a walker and does not use oxygen at home.  He does not have any dementia and able to take care of his medications.  Lives with his wife who also walks with a walker and son lives nearby. Noted to have acute exacerbation of chronic systolic CHF.  Also noted to have blood pressure borderline.  PAST MEDICAL HISTORY:   Past Medical History:  Diagnosis Date  . Aortic stenosis   . CHF (congestive heart failure) (HCC)    EF 35%, severe AS  . Diabetes mellitus without complication (HCC)   . Dyspnea   . Fracture of right tibial plateau   . H/O cardiac catheterization   . Hyperlipemia   . Hypertension   . Kidney disease   . Migraine headache   . Mitral regurgitation   . Peripheral edema   . Stenosis of right carotid artery     PAST SURGICAL HISTORY:  Past Surgical History:  Procedure Laterality Date  . cardiac peacemaker placement    . CARDIAC SURGERY    . JOINT  REPLACEMENT    . left hip replacement      SOCIAL HISTORY:  Social History   Tobacco Use  . Smoking status: Never Smoker  . Smokeless tobacco: Never Used  Substance Use Topics  . Alcohol use: Never    Frequency: Never    FAMILY HISTORY: No family history on file.  DRUG ALLERGIES:  Allergies  Allergen Reactions  . Iodine   . Nylon   . Penicillins   . Sulfa Antibiotics   . Tape     REVIEW OF SYSTEMS:   CONSTITUTIONAL: No fever, have fatigue or weakness.  EYES: No blurred or double vision.  EARS, NOSE, AND THROAT: No tinnitus or ear pain.  RESPIRATORY: No cough, have shortness of breath, no wheezing or hemoptysis.  CARDIOVASCULAR: He have chest pain, orthopnea, edema.  GASTROINTESTINAL: No nausea, vomiting, diarrhea or abdominal pain.  GENITOURINARY: No dysuria, hematuria.  ENDOCRINE: No polyuria, nocturia,  HEMATOLOGY: No anemia, easy bruising or bleeding SKIN: No rash or lesion. MUSCULOSKELETAL: No joint pain or arthritis.   NEUROLOGIC: No tingling, numbness, weakness.  PSYCHIATRY: No anxiety or depression.   MEDICATIONS AT HOME:  Prior to Admission medications   Medication Sig Start Date End Date Taking? Authorizing Provider  amoxicillin-clavulanate (AUGMENTIN) 875-125 MG tablet Take 1 tablet by mouth 2 (two) times daily.  12/13/18 12/23/18 Yes [provider]  aspirin EC 81 MG tablet Take 81 mg  by mouth daily.    Yes [provider]  diphenhydrAMINE (BENADRYL) 25 mg capsule Take 25 mg by mouth daily.    Yes [provider]  finasteride (PROSCAR) 5 MG tablet Take 5 mg by mouth daily.  11/28/18 11/28/19 Yes [provider]  furosemide (LASIX) 40 MG tablet Take 80 mg by mouth daily.  11/28/18  Yes [provider]  ipratropium-albuterol (DUONEB) 0.5-2.5 (3) MG/3ML SOLN Inhale 3 mLs into the lungs every 6 (six) hours as needed.  12/13/18  Yes [provider]  lisinopril (PRINIVIL,ZESTRIL) 5 MG tablet Take 5 mg by mouth 2  (two) times daily.   Yes [provider]  spironolactone (ALDACTONE) 50 MG tablet Take 50 mg by mouth daily.  12/13/18  Yes [provider]  tamsulosin (FLOMAX) 0.4 MG CAPS capsule Take 0.8 mg by mouth at bedtime.    Yes [provider]      PHYSICAL EXAMINATION:   VITAL SIGNS: Blood pressure 113/77, pulse 65, temperature 97.6 F (36.4 C), temperature source Oral, resp. rate (!) 26, height 6' (1.829 m), weight 90.3 kg, SpO2 98 %.  GENERAL:  85 y.o.-year-old patient lying in the bed with no acute distress.  EYES: Pupils equal, round, reactive to light and accommodation. No scleral icterus. Extraocular muscles intact.  HEENT: Head atraumatic, normocephalic. Oropharynx and nasopharynx clear.  NECK:  Supple, no jugular venous distention. No thyroid enlargement, no tenderness.  LUNGS: Normal breath sounds bilaterally, no wheezing, bilateral crepitation. No use of accessory muscles of respiration.  CARDIOVASCULAR: S1, S2 normal. No murmurs, rubs, or gallops.  ABDOMEN: Soft, nontender, nondistended. Bowel sounds present. No organomegaly or mass.  EXTREMITIES: Bilateral pitting pedal edema, no cyanosis, or clubbing.  NEUROLOGIC: Cranial nerves II through XII are intact. Muscle strength 4/5 in all extremities. Sensation intact. Gait not checked.  PSYCHIATRIC: The patient is alert and oriented x 3.  SKIN: No obvious rash, lesion, or ulcer.   LABORATORY PANEL:   CBC Recent Labs  Lab 12/15/18 1253  WBC 7.3  HGB 12.6*  HCT 38.1*  PLT 182  MCV 89.6  MCH 29.6  MCHC 33.1  RDW 14.5  LYMPHSABS 0.9  MONOABS 0.8  EOSABS 0.1  BASOSABS 0.0   ------------------------------------------------------------------------------------------------------------------  Chemistries  Recent Labs  Lab 12/15/18 1253  NA 131*  K 2.7*  CL 92*  CO2 26  GLUCOSE 107*  BUN 21  CREATININE 1.46*  CALCIUM 8.9  MG 1.9  AST 22  ALT 13  ALKPHOS 77  BILITOT 0.9    ------------------------------------------------------------------------------------------------------------------ estimated creatinine clearance is 35.4 mL/min (A) (by C-G formula based on SCr of 1.46 mg/dL (H)). ------------------------------------------------------------------------------------------------------------------ No results for input(s): TSH, T4TOTAL, T3FREE, THYROIDAB in the last 72 hours.  Invalid input(s): FREET3   Coagulation profile No results for input(s): INR, PROTIME in the last 168 hours. ------------------------------------------------------------------------------------------------------------------- No results for input(s): DDIMER in the last 72 hours. -------------------------------------------------------------------------------------------------------------------  Cardiac Enzymes No results for input(s): CKMB, TROPONINI, MYOGLOBIN in the last 168 hours.  Invalid input(s): CK ------------------------------------------------------------------------------------------------------------------ Invalid input(s): POCBNP  ---------------------------------------------------------------------------------------------------------------  Urinalysis No results found for: COLORURINE, APPEARANCEUR, LABSPEC, PHURINE, GLUCOSEU, HGBUR, BILIRUBINUR, KETONESUR, PROTEINUR, UROBILINOGEN, NITRITE, LEUKOCYTESUR   RADIOLOGY: Dg Chest Portable 1 View  Result Date: 12/15/2018 CLINICAL DATA:  85 year old male with a history of shortness of breath EXAM: PORTABLE CHEST 1 VIEW COMPARISON:  June 19, 2011 FINDINGS: Cardiomediastinal silhouette unchanged in size and contour. Low lung volumes with coarsened interstitial markings and interlobular septal thickening. Thickening of the minor fissure. Blunting  of the bilateral costophrenic angles.  No pneumothorax. Unchanged position of the left chest wall pacing device. IMPRESSION: Evidence of pulmonary edema and bilateral pleural effusions,  with atypical infection not excluded. Electronically Signed   By: Corrie Mckusick D.O.   On: 12/15/2018 13:52    EKG: Orders placed or performed during the hospital encounter of 12/15/18  . EKG 12-Lead  . EKG 12-Lead  . ED EKG  . ED EKG    IMPRESSION AND PLAN:  *Acute on chronic systolic congestive heart failure We will give IV Lasix, daily weight and intake and output measurement. Cardiology evaluation while in the hospital. Currently I will hold his lisinopril due to borderline blood pressure and worsening in the renal function. Currently we cannot continue Spironolactone for the same reasons. Not a candidate of beta-blockers for borderline blood pressure.  *Atrial fibrillation and patient's feeling of palpitation Patient has permanent pacemaker. Pacemaker evaluation is requested by ER physician.  I have called cardiology consult.  *Hypokalemia ER physician has requested oral replacement, I would check tomorrow and monitor.   \  *Slight worsening in chronic renal failure stage III Continue to monitor with Lasix use.  *Elevated troponin Likely secondary to heart failure and arrhythmia Monitor on telemetry and follow serial troponin.   All the records are reviewed and case discussed with ED provider. Management plans discussed with the patient, family and they are in agreement.  CODE STATUS: DNR  Patient son was present in the room during my visit.  TOTAL TIME TAKING CARE OF THIS PATIENT: 50 minutes.    Vaughan Basta M.D on 12/15/2018   Between 7am to 6pm - Pager - [phone]  After 6pm go to [email or web address] - password [email or web address] [facility]  Office  [phone]  CC: Primary care physician; Sofie Hartigan, MD   Note: This dictation was prepared with Dragon dictation along with smaller phrase technology. Any transcriptional errors that result from this process are unintentional.

## 2018-12-15 NOTE — Progress Notes (Addendum)
Family Meeting Note  Advance Directive:yes  Today a meeting took place with the Patient and son.  The following clinical team members were present during this meeting:MD  The following were discussed:Patient's diagnosis: Chronic systolic congestive heart failure, severe aortic stenosis, status post pacemaker, chronic kidney disease stage III, Patient's progosis: Unable to determine and Goals for treatment:  Do CPR and gave meds but no intubation for support.  Additional follow-up to be provided: Cardiology  Time spent during discussion:20 minutes  Vaughan Basta, MD

## 2018-12-15 NOTE — Progress Notes (Signed)
MD states we will trend one more troponin to see if starts to increase or trend down. MD thinking its CHF and demand ischemia. I will continue to assess.

## 2018-12-15 NOTE — ED Notes (Addendum)
Date and time results received: 12/15/18 3:42 PM (use smartphrase ".now" to insert current time)  Test: Troponin Critical Value: 105  Name of Provider Notified: Malinda  Orders Received? Or Actions Taken?: No new orders at this time

## 2018-12-15 NOTE — ED Triage Notes (Signed)
Per patient son , pt with Dyspnea x 1 week. Son stated patient was seen x 3 days ago and was given antibiotic and have x-ray done. Per son patient not doing any better.

## 2018-12-16 ENCOUNTER — Inpatient Hospital Stay
Admit: 2018-12-16 | Discharge: 2018-12-16 | Disposition: A | Discharge status: Home / Self Care | Payer: Medicare Other | Attending: Internal Medicine | Admitting: Internal Medicine

## 2018-12-16 LAB — ECHOCARDIOGRAM COMPLETE
Height: 72 in
Weight: 3214.13 oz

## 2018-12-16 LAB — BASIC METABOLIC PANEL
Anion gap: 12 (ref 5–15)
BUN: 21 mg/dL (ref 8–23)
CO2: 27 mmol/L (ref 22–32)
Calcium: 8.9 mg/dL (ref 8.9–10.3)
Chloride: 94 mmol/L — ABNORMAL LOW (ref 98–111)
Creatinine, Ser: 1.31 mg/dL — ABNORMAL HIGH (ref 0.61–1.24)
GFR calc Af Amer: 54 mL/min — ABNORMAL LOW (ref >60)
GFR calc non Af Amer: 47 mL/min — ABNORMAL LOW (ref >60)
Glucose, Bld: 110 mg/dL — ABNORMAL HIGH (ref 70–99)
Potassium: 3.4 mmol/L — ABNORMAL LOW (ref 3.5–5.1)
Sodium: 133 mmol/L — ABNORMAL LOW (ref 135–145)

## 2018-12-16 LAB — NOVEL CORONAVIRUS, NAA (HOSP ORDER, SEND-OUT TO REF LAB; TAT 18-24 HRS): SARS-CoV-2, NAA: NOT DETECTED

## 2018-12-16 MED ORDER — PERFLUTREN LIPID MICROSPHERE
1.0000 mL | INTRAVENOUS | Status: AC | PRN
  Administered 2018-12-16: 4 mL via INTRAVENOUS
  Filled 2018-12-16: qty 10

## 2018-12-16 MED ORDER — FUROSEMIDE 10 MG/ML IJ SOLN
20.0000 mg | Freq: Two times a day (BID) | INTRAMUSCULAR | Status: DC
  Administered 2018-12-16 – 2018-12-17 (×2): 20 mg via INTRAVENOUS
  Filled 2018-12-16 (×2): qty 2

## 2018-12-16 MED ORDER — METOPROLOL TARTRATE 25 MG PO TABS
25.0000 mg | ORAL_TABLET | Freq: Two times a day (BID) | ORAL | Status: DC
  Administered 2018-12-16 – 2018-12-17 (×2): 25 mg via ORAL
  Filled 2018-12-16 (×2): qty 1

## 2018-12-16 NOTE — Consult Note (Signed)
[facility] Cardiology Consultation Note  Patient ID: Lawrence Aguirre., MRN: [id number], DOB/AGE: Mar 02, 1925 85 y.o. Admit date: 12/15/2018   Date of Consult: 12/16/2018 Primary Physician: Marina Goodell, MD Primary Cardiologist: Paraschos  Chief Complaint:  Chief Complaint  Patient presents with  . Leg Swelling  . Weakness   Reason for Consult: Heart failure  HPI: 85 y.o. male with known severe aortic valve stenosis and chronic systolic dysfunction congestive heart failure status post previous pacemaker placement for heart block and chronic kidney disease stage III and known coronary disease status post previous stent in 2008.  The patient has had significant new worsening shortness of breath lower extremity edema and palpitations.  After evaluation in the emergency room the patient does have anemia with a hemoglobin of 12.6 low potassium of 2.7 a glomerular filtration rate of 41 troponin of 114.  Pacemaker interrogation shows high atrial rate consistent with atrial flutter and atrial fibrillation as well as a chest x-ray showing pulmonary edema.  The patient is slightly more short of breath throughout the evening but this morning slightly improved.  Heart rate currently is erratic but overall ventricular pacing rate is about 60 bpm.  He does have a murmur and an echocardiogram recently showing severe LV systolic dysfunction with ejection fraction of 20% and 6 moderate to severe aortic stenosis unchanged from before.  All of these are contributing to his significant symptoms at this time  Past Medical History:  Diagnosis Date  . Aortic stenosis   . CHF (congestive heart failure) (HCC)    EF 35%, severe AS  . Diabetes mellitus without complication (HCC)   . Dyspnea   . Fracture of right tibial plateau   . H/O cardiac catheterization   . Hyperlipemia   . Hypertension   . Kidney disease   . Migraine headache   . Mitral regurgitation   . Peripheral edema   . Stenosis of right  carotid artery       Surgical History:  Past Surgical History:  Procedure Laterality Date  . cardiac peacemaker placement    . CARDIAC SURGERY    . JOINT REPLACEMENT    . left hip replacement       Home Meds: Prior to Admission medications   Medication Sig Start Date End Date Taking? Authorizing Provider  amoxicillin-clavulanate (AUGMENTIN) 875-125 MG tablet Take 1 tablet by mouth 2 (two) times daily.  12/13/18 12/23/18 Yes [provider]  aspirin EC 81 MG tablet Take 81 mg by mouth daily.    Yes [provider]  diphenhydrAMINE (BENADRYL) 25 mg capsule Take 25 mg by mouth daily.    Yes [provider]  finasteride (PROSCAR) 5 MG tablet Take 5 mg by mouth daily.  11/28/18 11/28/19 Yes [provider]  furosemide (LASIX) 40 MG tablet Take 80 mg by mouth daily.  11/28/18  Yes [provider]  ipratropium-albuterol (DUONEB) 0.5-2.5 (3) MG/3ML SOLN Inhale 3 mLs into the lungs every 6 (six) hours as needed.  12/13/18  Yes [provider]  lisinopril (PRINIVIL,ZESTRIL) 5 MG tablet Take 5 mg by mouth 2 (two) times daily.   Yes [provider]  spironolactone (ALDACTONE) 50 MG tablet Take 50 mg by mouth daily.  12/13/18  Yes [provider]  tamsulosin (FLOMAX) 0.4 MG CAPS capsule Take 0.8 mg by mouth at bedtime.    Yes [provider]    Inpatient Medications:  . aspirin EC  81 mg Oral Daily  . finasteride  5 mg Oral Daily  . furosemide  20 mg Intravenous Q12H  . heparin  5,000 Units Subcutaneous Q8H  . influenza vaccine adjuvanted  0.5 mL Intramuscular Tomorrow-1000  . potassium chloride  20 mEq Oral BID  . sodium chloride flush  3 mL Intravenous Q12H  . tamsulosin  0.8 mg Oral QHS   . sodium chloride      Allergies:  Allergies  Allergen Reactions  . Iodine   . Nylon   . Penicillins   . Sulfa Antibiotics   . Tape     Social History   Socioeconomic History  . Marital status: Married    Spouse name:  Not on file  . Number of children: Not on file  . Years of education: Not on file  . Highest education level: Not on file  Occupational History  . Not on file  Social Needs  . Financial resource strain: Not on file  . Food insecurity    Worry: Not on file    Inability: Not on file  . Transportation needs    Medical: Not on file    Non-medical: Not on file  Tobacco Use  . Smoking status: Never Smoker  . Smokeless tobacco: Never Used  Substance and Sexual Activity  . Alcohol use: Never    Frequency: Never  . Drug use: Never  . Sexual activity: Not on file  Lifestyle  . Physical activity    Days per week: Not on file    Minutes per session: Not on file  . Stress: Not on file  Relationships  . Social Musician on phone: Not on file    Gets together: Not on file    Attends religious service: Not on file    Active member of club or organization: Not on file    Attends meetings of clubs or organizations: Not on file    Relationship status: Not on file  . Intimate partner violence    Fear of current or ex partner: Not on file    Emotionally abused: Not on file    Physically abused: Not on file    Forced sexual activity: Not on file  Other Topics Concern  . Not on file  Social History Narrative  . Not on file     History reviewed. No pertinent family history.   Review of Systems Positive for shortness of breath weakness palpitations Negative for: General:  chills, fever, night sweats or weight changes.  Cardiovascular: PND orthopnea syncope dizziness  Dermatological skin lesions rashes Respiratory: Cough congestion Urologic: Frequent urination urination at night and hematuria Abdominal: negative for nausea, vomiting, diarrhea, bright red blood per rectum, melena, or hematemesis Neurologic: negative for visual changes, and/or hearing changes  All other systems reviewed and are otherwise negative except as noted above.  Labs: No results for input(s):  CKTOTAL, CKMB, TROPONINI in the last 72 hours. Lab Results  Component Value Date   WBC 7.3 12/15/2018   HGB 12.6 (L) 12/15/2018   HCT 38.1 (L) 12/15/2018   MCV 89.6 12/15/2018   PLT 182 12/15/2018    Recent Labs  Lab 12/15/18 1253 12/16/18 0429  NA 131* 133*  K 2.7* 3.4*  CL 92* 94*  CO2 26 27  BUN 21 21  CREATININE 1.46* 1.31*  CALCIUM 8.9 8.9  PROT 7.0  --   BILITOT 0.9  --   ALKPHOS 77  --   ALT 13  --   AST 22  --  GLUCOSE 107* 110*   No results found for: CHOL, HDL, LDLCALC, TRIG No results found for: DDIMER  Radiology/Studies:  [place] Venous Img Lower Bilateral  Result Date: 11/28/2018 CLINICAL DATA:  Bilateral edema x3 weeks EXAM: BILATERAL LOWER EXTREMITY VENOUS DOPPLER ULTRASOUND TECHNIQUE: Gray-scale sonography with compression, as well as color and duplex ultrasound, were performed to evaluate the deep venous system from the level of the common femoral vein through the popliteal and proximal calf veins. COMPARISON:  07/29/2014 FINDINGS: Normal compressibility of the common femoral, superficial femoral, and popliteal veins, as well as the proximal calf veins. No filling defects to suggest DVT on grayscale or color Doppler imaging. Doppler waveforms show normal direction of venous flow, normal respiratory phasicity and response to augmentation. Subcutaneous edema in bilateral calves. 1.2 cm prominent left inguinal lymph node, possibly reactive but nonspecific. Elongated 5.5 x 1.3 x 1.7 cm mildly complex fluid collection in the left posterior popliteal fossa. IMPRESSION: 1. No femoropopliteal and no calf DVT in the visualized calf veins. If clinical symptoms are inconsistent or if there are persistent or worsening symptoms, further imaging (possibly involving the iliac veins) may be warranted. 2. Left Baker's cyst. Electronically Signed   By: Corlis Leak  Hassell M.D.   On: 11/28/2018 17:40   Dg Chest Portable 1 View  Result Date: 12/15/2018 CLINICAL DATA:  85 year old male with a  history of shortness of breath EXAM: PORTABLE CHEST 1 VIEW COMPARISON:  June 19, 2011 FINDINGS: Cardiomediastinal silhouette unchanged in size and contour. Low lung volumes with coarsened interstitial markings and interlobular septal thickening. Thickening of the minor fissure. Blunting of the bilateral costophrenic angles.  No pneumothorax. Unchanged position of the left chest wall pacing device. IMPRESSION: Evidence of pulmonary edema and bilateral pleural effusions, with atypical infection not excluded. Electronically Signed   By: Gilmer MorJaime  Wagner D.O.   On: 12/15/2018 13:52    EKG: Normal sinus rhythm with ventricular pacing  Weights: Filed Weights   12/15/18 1243 12/15/18 1654 12/16/18 0100  Weight: 90.3 kg 90.6 kg 91.1 kg     Physical Exam: Blood pressure 94/63, pulse 61, temperature 97.8 F (36.6 C), temperature source Oral, resp. rate 19, height 6' (1.829 m), weight 91.1 kg, SpO2 96 %. Body mass index is 27.24 kg/m. General: Well developed, well nourished, in no acute distress. Head eyes ears nose throat: Normocephalic, atraumatic, sclera non-icteric, no xanthomas, nares are without discharge. No apparent thyromegaly and/or mass  Lungs: Normal respiratory effort.  no wheezes, basilar rales, no rhonchi.  Heart: RRR with normal S1 S2.  3-4+ right upper sternal border murmur gallop, no rub, PMI is normal size and placement, carotid upstroke normal without bruit, jugular venous pressure is normal Abdomen: Soft, non-tender, non-distended with normoactive bowel sounds. No hepatomegaly. No rebound/guarding. No obvious abdominal masses. Abdominal aorta is normal size without bruit Extremities: 1-2+ edema. no cyanosis, no clubbing, no ulcers  Peripheral : 2+ bilateral upper extremity pulses, 2+ bilateral femoral pulses, 2+ bilateral dorsal pedal pulse Neuro: Alert and oriented. No facial asymmetry. No focal deficit. Moves all extremities spontaneously. Musculoskeletal: Normal muscle tone without  kyphosis Psych:  Responds to questions appropriately with a normal affect.    Assessment: 85 year old male with severe aortic valve stenosis acute on chronic systolic dysfunction congestive heart failure elevated troponin consistent with demand ischemia chronic kidney disease anemia and coronary artery disease without evidence of myocardial infarction having episodes of atrial fibrillation and palpitations needing further treatment options  Plan: 1.  Continue intravenous Lasix watching closely for  concerns of significant chronic kidney disease due to pulmonary edema and severe aortic valve stenosis and LV systolic dysfunction 2.  Begin metoprolol as able for heart rate control of atrial fibrillation and rapid ventricular rate and aortic valve stenosis and LV systolic dysfunction 3.  No further cardiac intervention other than echocardiogram previously done for elevated troponin consistent with demand ischemia 4.  Begin ambulation and further treatment as above but cannot perform surgical intervention of aortic valve stenosis due to significant age and risk  Signed, Corey Skains M.D. [facility] 12/16/2018, 4:07 PM

## 2018-12-16 NOTE — Progress Notes (Addendum)
[employer] [facility] [place] [facility]   PATIENT NAME: Lawrence Aguirre    MR#:  [id number]  DATE OF BIRTH:  [birth year]  SUBJECTIVE:  sob  REVIEW OF SYSTEMS:    Review of Systems  Constitutional: Negative for fever, chills weight loss HENT: Negative for ear pain, nosebleeds, congestion, facial swelling, rhinorrhea, neck pain, neck stiffness and ear discharge.   Respiratory: Negative for cough, ++ shortness of breath, wheezing  Cardiovascular: Negative for chest pain, palpitations and ++leg swelling.  Gastrointestinal: Negative for heartburn, abdominal pain, vomiting, diarrhea or consitpation Genitourinary: Negative for dysuria, urgency, frequency, hematuria Musculoskeletal: Negative for back pain or joint pain Neurological: Negative for dizziness, seizures, syncope, focal weakness,  numbness and headaches.  Hematological: Does not bruise/bleed easily.  Psychiatric/Behavioral: Negative for hallucinations, confusion, dysphoric mood    Tolerating Diet: yes      DRUG ALLERGIES:   Allergies  Allergen Reactions  . Iodine   . Nylon   . Penicillins   . Sulfa Antibiotics   . Tape     VITALS:  Blood pressure 119/85, pulse 65, temperature 97.7 F (36.5 C), temperature source Oral, resp. rate 19, height 6' (1.829 m), weight 91.1 kg, SpO2 97 %.  PHYSICAL EXAMINATION:  Constitutional: Appears well-developed and well-nourished. No distress. HENT: Normocephalic. Marland Kitchen Oropharynx is clear and moist.  Eyes: Conjunctivae and EOM are normal. PERRLA, no scleral icterus.  Neck: Normal ROM. Neck supple. No JVD. No tracheal deviation. CVS: RRR, S1/S2 +, 4/6 SEM no gallops, no carotid bruit.  Pulmonary: Effort and breath sounds normal, no stridor, rhonchi, wheezes, rales.  Abdominal: Soft. BS +,  no distension, tenderness, rebound or guarding.  Musculoskeletal: Normal range of motion.3+ LEEedema and no tenderness.  Neuro: Alert. CN 2-12 grossly intact. No focal deficits. Skin: Skin  is warm and dry. No rash noted. Psychiatric: Normal mood and affect.      LABORATORY PANEL:   CBC Recent Labs  Lab 12/15/18 1253  WBC 7.3  HGB 12.6*  HCT 38.1*  PLT 182   ------------------------------------------------------------------------------------------------------------------  Chemistries  Recent Labs  Lab 12/15/18 1253 12/16/18 0429  NA 131* 133*  K 2.7* 3.4*  CL 92* 94*  CO2 26 27  GLUCOSE 107* 110*  BUN 21 21  CREATININE 1.46* 1.31*  CALCIUM 8.9 8.9  MG 1.9  --   AST 22  --   ALT 13  --   ALKPHOS 77  --   BILITOT 0.9  --    ------------------------------------------------------------------------------------------------------------------  Cardiac Enzymes No results for input(s): TROPONINI in the last 168 hours. ------------------------------------------------------------------------------------------------------------------  RADIOLOGY:  Dg Chest Portable 1 View  Result Date: 12/15/2018 CLINICAL DATA:  85 year old male with a history of shortness of breath EXAM: PORTABLE CHEST 1 VIEW COMPARISON:  June 19, 2011 FINDINGS: Cardiomediastinal silhouette unchanged in size and contour. Low lung volumes with coarsened interstitial markings and interlobular septal thickening. Thickening of the minor fissure. Blunting of the bilateral costophrenic angles.  No pneumothorax. Unchanged position of the left chest wall pacing device. IMPRESSION: Evidence of pulmonary edema and bilateral pleural effusions, with atypical infection not excluded. Electronically Signed   By: Gilmer Mor D.O.   On: 12/15/2018 13:52     ASSESSMENT AND PLAN:   85 year old male with chronic combined systolic and diastolic heart failure with EF of 35%, severe aortic stenosis and status post pacemaker who presented to the emergency room due to generalized weakness and shortness of breath.  1.  Acute on chronic combined systolic and diastolic heart  failure with ejection fraction of 35% in the  setting of severe aortic stenosis: Need to continue with gentle diuresis as patient still symptomatic. Suspect  shortness of breath is also from severe left ear. Holding blood pressure medications due to low/normal blood pressure at this time.  2.  Severe AS: Patient now admitted with CHF exacerbation from underlying severe left ear.  This portends poor prognosis.  Patient will need outpatient palliative care services.  Cardiology consultation pending.  3.  Pacemaker: Pacemaker evaluation  4.  Hypokalemia: Repleted  5.  Acute on chronic kidney disease stage III in the setting of CHF exacerbation: Creatinine improved with  Diuresis.  6.  Elevated troponin due to demand ischemia: Patient ruled out for ACS. 7.  BPH: Continue Proscar   Management plans discussed with the patient and he is in agreement.  CODE STATUS: partial  TOTAL TIME TAKING CARE OF THIS PATIENT: 30 minutes.     POSSIBLE D/C 2-3 days, DEPENDING ON CLINICAL CONDITION.   Bettey Costa M.D on 12/16/2018 at 9:30 AM  Between 7am to 6pm - Pager - [phone] After 6pm go to [email or web address] - password [email or web address] [facility]  Office  [phone]  CC: Primary care physician; Sofie Hartigan, MD  Note: This dictation was prepared with Dragon dictation along with smaller phrase technology. Any transcriptional errors that result from this process are unintentional.

## 2018-12-16 NOTE — Progress Notes (Signed)
   12/16/18 1445  Clinical Encounter Type  Visited With Patient and family together  Visit Type Initial  Referral From Nurse  Consult/Referral To Chaplain  Spiritual Encounters  Spiritual Needs Emotional  Patient and daughter were in hospital room upon arrival. Patient was alert and awake. Patient struggled to speak clearly due to shortness of breath. Expressed how he was having a bad day. Shared concerns with health. [place] introduced self and shared that patient's local pastor was praying for him. [place] relayed this message from [facility]. Patient and daughter appreciated the message. Pastoral visit was appreciated. No further visits needed at this time.

## 2018-12-17 ENCOUNTER — Inpatient Hospital Stay: Payer: Medicare Other

## 2018-12-17 LAB — BASIC METABOLIC PANEL
Anion gap: 12 (ref 5–15)
BUN: 34 mg/dL — ABNORMAL HIGH (ref 8–23)
CO2: 25 mmol/L (ref 22–32)
Calcium: 8.9 mg/dL (ref 8.9–10.3)
Chloride: 93 mmol/L — ABNORMAL LOW (ref 98–111)
Creatinine, Ser: 1.57 mg/dL — ABNORMAL HIGH (ref 0.61–1.24)
GFR calc Af Amer: 44 mL/min — ABNORMAL LOW (ref >60)
GFR calc non Af Amer: 38 mL/min — ABNORMAL LOW (ref >60)
Glucose, Bld: 118 mg/dL — ABNORMAL HIGH (ref 70–99)
Potassium: 4.3 mmol/L (ref 3.5–5.1)
Sodium: 130 mmol/L — ABNORMAL LOW (ref 135–145)

## 2018-12-17 MED ORDER — FUROSEMIDE 10 MG/ML IJ SOLN: 40.0000 mg | Freq: Every day | INTRAMUSCULAR | Status: DC

## 2018-12-17 MED ORDER — AMOXICILLIN-POT CLAVULANATE 875-125 MG PO TABS
1.0000 | ORAL_TABLET | Freq: Two times a day (BID) | ORAL | Status: DC
  Administered 2018-12-17 – 2018-12-18 (×3): 1 via ORAL
  Filled 2018-12-17 (×3): qty 1

## 2018-12-17 NOTE — Progress Notes (Signed)
[facility] Cardiology [facility] Encounter Note  Patient: Lawrence Aguirre. / Admit Date: 12/15/2018 / Date of Encounter: 12/17/2018, 8:44 AM   Subjective: Patient is still severely short of breath with lower extremity edema and weakness and fatigue.  Chest x-ray shows right pleural effusion and small left pleural effusion with some pulmonary edema consistent with congestive heart failure Echocardiogram showing severe LV systolic dysfunction with ejection fraction of 20%.  Aortic valve stenosis moderate to severe slightly worse than before and mainly contributing to his current condition and symptoms.  No evidence of chest pain at this time.  No evidence of rhythm disturbances requiring further intervention other than minimal metoprolol  Review of Systems: Positive for: Shortness of breath edema Negative for: Vision change, hearing change, syncope, dizziness, nausea, vomiting,diarrhea, bloody stool, stomach pain, cough, congestion, diaphoresis, urinary frequency, urinary pain,skin lesions, skin rashes Others previously listed  Objective: Telemetry: Normal sinus rhythm with ventricular pacing Physical Exam: Blood pressure 129/68, pulse 70, temperature 97.9 F (36.6 C), resp. rate 19, height 6' (1.829 m), weight 91.7 kg, SpO2 95 %. Body mass index is 27.43 kg/m. General: Well developed, well nourished, in no acute distress. Head: Normocephalic, atraumatic, sclera non-icteric, no xanthomas, nares are without discharge. Neck: No apparent masses Lungs: Normal respirations with few wheezes, no rhonchi, basilar rales , diffuse crackles right basilar decreased breath sounds  Heart: Regular rate and rhythm, normal S1 S2, right upper sternal border murmur, no rub, no gallop, PMI is normal size and placement, carotid upstroke normal without bruit, jugular venous pressure normal Abdomen: Soft, non-tender, non-distended with normoactive bowel sounds. No hepatosplenomegaly. Abdominal aorta is normal size  without bruit Extremities: 1-2+ edema, no clubbing, no cyanosis, no ulcers,  Peripheral: 2+ radial, 2+ femoral, 0+ dorsal pedal pulses Neuro: Alert and oriented. Moves all extremities spontaneously. Psych:  Responds to questions appropriately with a normal affect.   Intake/Output Summary (Last 24 hours) at 12/17/2018 0844 Last data filed at 12/16/2018 1821 Gross per 24 hour  Intake 720 ml  Output 575 ml  Net 145 ml    Inpatient Medications:  . aspirin EC  81 mg Oral Daily  . finasteride  5 mg Oral Daily  . heparin  5,000 Units Subcutaneous Q8H  . influenza vaccine adjuvanted  0.5 mL Intramuscular Tomorrow-1000  . metoprolol tartrate  25 mg Oral BID  . potassium chloride  20 mEq Oral BID  . sodium chloride flush  3 mL Intravenous Q12H  . tamsulosin  0.8 mg Oral QHS   Infusions:  . sodium chloride      Labs: Recent Labs    12/15/18 1253 12/16/18 0429 12/17/18 0422  NA 131* 133* 130*  K 2.7* 3.4* 4.3  CL 92* 94* 93*  CO2 26 27 25   GLUCOSE 107* 110* 118*  BUN 21 21 34*  CREATININE 1.46* 1.31* 1.57*  CALCIUM 8.9 8.9 8.9  MG 1.9  --   --    Recent Labs    12/15/18 1253  AST 22  ALT 13  ALKPHOS 77  BILITOT 0.9  PROT 7.0  ALBUMIN 3.9   Recent Labs    12/15/18 1253  WBC 7.3  NEUTROABS 5.4  HGB 12.6*  HCT 38.1*  MCV 89.6  PLT 182   No results for input(s): CKTOTAL, CKMB, TROPONINI in the last 72 hours. Invalid input(s): POCBNP No results for input(s): HGBA1C in the last 72 hours.   Weights: Filed Weights   12/15/18 1654 12/16/18 0100 12/17/18 0425  Weight: 90.6  kg 91.1 kg 91.7 kg     Radiology/Studies:  [place] Venous Img Lower Bilateral  Result Date: 11/28/2018 CLINICAL DATA:  Bilateral edema x3 weeks EXAM: BILATERAL LOWER EXTREMITY VENOUS DOPPLER ULTRASOUND TECHNIQUE: Gray-scale sonography with compression, as well as color and duplex ultrasound, were performed to evaluate the deep venous system from the level of the common femoral vein through the  popliteal and proximal calf veins. COMPARISON:  07/29/2014 FINDINGS: Normal compressibility of the common femoral, superficial femoral, and popliteal veins, as well as the proximal calf veins. No filling defects to suggest DVT on grayscale or color Doppler imaging. Doppler waveforms show normal direction of venous flow, normal respiratory phasicity and response to augmentation. Subcutaneous edema in bilateral calves. 1.2 cm prominent left inguinal lymph node, possibly reactive but nonspecific. Elongated 5.5 x 1.3 x 1.7 cm mildly complex fluid collection in the left posterior popliteal fossa. IMPRESSION: 1. No femoropopliteal and no calf DVT in the visualized calf veins. If clinical symptoms are inconsistent or if there are persistent or worsening symptoms, further imaging (possibly involving the iliac veins) may be warranted. 2. Left Baker's cyst. Electronically Signed   By: Lucrezia Europe M.D.   On: 11/28/2018 17:40   Dg Chest Portable 1 View  Result Date: 12/15/2018 CLINICAL DATA:  85 year old male with a history of shortness of breath EXAM: PORTABLE CHEST 1 VIEW COMPARISON:  June 19, 2011 FINDINGS: Cardiomediastinal silhouette unchanged in size and contour. Low lung volumes with coarsened interstitial markings and interlobular septal thickening. Thickening of the minor fissure. Blunting of the bilateral costophrenic angles.  No pneumothorax. Unchanged position of the left chest wall pacing device. IMPRESSION: Evidence of pulmonary edema and bilateral pleural effusions, with atypical infection not excluded. Electronically Signed   By: Corrie Mckusick D.O.   On: 12/15/2018 13:52     Assessment and Recommendation  85 y.o. male with known coronary artery disease hypertension hyperlipidemia previous heart block status post pacemaker placement with acute on chronic systolic dysfunction congestive heart failure with worsening ejection fraction and worsening aortic valve stenosis likely causing current symptoms and no  current evidence of myocardial infarction with minimal elevation of troponin most consistent with demand ischemia 1.  Continuation of intravenous Lasix for acute on chronic systolic dysfunction congestive heart failure with aortic valve stenosis 2.  Metoprolol for elevation of heart rate and irregular heartbeat seen on admission 3.  Further consideration of treatment of right pleural effusion if still causing significant shortness of breath 4.  No further cardiac intervention or other diagnostics necessary at this time Signed, Serafina Royals M.D. FACC

## 2018-12-17 NOTE — Evaluation (Signed)
Physical Therapy Evaluation Patient Details Name: Lawrence Aguirre. MRN: [id number] DOB: 10-03-1924 Today's Date: 12/17/2018   History of Present Illness  Pt is a 85 y.o. male presenting to hospital with increased LE swelling and SOB.  Pt admitted with acute on chronic systolic CHF, a-fib, hypokalemia, slightly worsening chronic renal failure stage III, elevated troponin (demand ischemia per cardiology), R pleural effusion and small L pleural effusion with some pulmonary edema consistent with CHF.  PMH includes DM, htn, HLD, CHF, chronic peripheral edema, chronic exertional dyspnea, CA, h/o fx R tibial plateau, h/o L hip replacement, and pacemaker.  Clinical Impression  Prior to hospital admission, pt was modified independent ambulating with RW.  Pt lives with his wife in 1 level home with ramp entry.  Currently pt is modified independent semi-supine to sit; CGA with transfers; and CGA ambulating 90 feet with RW.  Increased effort to stand noted as well as SOB with increased distance ambulating (O2 sats 92% or greater on room air during sessions activities).  Generalized weakness also noted.  Pt would benefit from skilled PT to address noted impairments and functional limitations (see below for any additional details).  Upon hospital discharge, recommend pt discharge with HHPT and support of family.    Follow Up Recommendations Home health PT    Equipment Recommendations  Rolling walker with 5" wheels;3in1 (PT)    Recommendations for Other Services       Precautions / Restrictions Precautions Precautions: Fall Restrictions Weight Bearing Restrictions: No      Mobility  Bed Mobility Overal bed mobility: Modified Independent             General bed mobility comments: Semi-supine to sit with mild increased effort; HOB elevated  Transfers Overall transfer level: Needs assistance Equipment used: Rolling walker (2 wheeled) Transfers: Sit to/from [employer] Sit to  Stand: Min guard Stand pivot transfers: Min guard       General transfer comment: mild increased effort to stand from bed and recliner; stand step turn bed to recliner with RW  Ambulation/Gait Ambulation/Gait assistance: Min guard Gait Distance (Feet): 90 Feet Assistive device: Rolling walker (2 wheeled)   Gait velocity: decreased   General Gait Details: partial step through gait pattern; occasional vc's to stay within RW with turns; steady  Information systems manager Rankin (Stroke Patients Only)       Balance Overall balance assessment: Needs assistance Sitting-balance support: No upper extremity supported;Feet supported Sitting balance-Leahy Scale: Good Sitting balance - Comments: steady sitting reaching within BOS   Standing balance support: No upper extremity supported Standing balance-Leahy Scale: Good Standing balance comment: steady standing reaching within BOS                             Pertinent Vitals/Pain Pain Assessment: No/denies pain  HR WFL during session's activities.    Home Living Family/patient expects to be discharged to:: Private residence Living Arrangements: Spouse/significant other Available Help at Discharge: Family Type of Home: House Home Access: Ramped entrance     Home Layout: One level Home Equipment: Environmental consultant - 2 wheels      Prior Function Level of Independence: Independent with assistive device(s)         Comments: Ambulatory with RW.  Son lives nearby and can assist as needed.  H/o 2 falls in past 6 months.  Hand Dominance        Extremity/Trunk Assessment   Upper Extremity Assessment Upper Extremity Assessment: Generalized weakness    Lower Extremity Assessment Lower Extremity Assessment: Generalized weakness    Cervical / Trunk Assessment Cervical / Trunk Assessment: Kyphotic  Communication   Communication: HOH  Cognition Arousal/Alertness: Awake/alert Behavior  During Therapy: WFL for tasks assessed/performed Overall Cognitive Status: Within Functional Limits for tasks assessed                                        General Comments   Nursing cleared pt for participation in physical therapy.  Pt agreeable to PT session.    Exercises     Assessment/Plan    PT Assessment Patient needs continued PT services  PT Problem List Decreased strength;Decreased activity tolerance;Decreased balance;Decreased mobility;Cardiopulmonary status limiting activity       PT Treatment Interventions DME instruction;Gait training;Functional mobility training;Therapeutic activities;Therapeutic exercise;Balance training;Patient/family education    PT Goals (Current goals can be found in the Care Plan section)  Acute Rehab PT Goals Patient Stated Goal: to improve breathing PT Goal Formulation: With patient Time For Goal Achievement: 12/31/18 Potential to Achieve Goals: Fair    Frequency Min 2X/week   Barriers to discharge        Co-evaluation               AM-PAC PT "6 Clicks" Mobility  Outcome Measure Help needed turning from your back to your side while in a flat bed without using bedrails?: None Help needed moving from lying on your back to sitting on the side of a flat bed without using bedrails?: None Help needed moving to and from a bed to a chair (including a wheelchair)?: A Little Help needed standing up from a chair using your arms (e.g., wheelchair or bedside chair)?: A Little Help needed to walk in hospital room?: A Little Help needed climbing 3-5 steps with a railing? : A Lot 6 Click Score: 19    End of Session Equipment Utilized During Treatment: Gait belt Activity Tolerance: Patient tolerated treatment well Patient left: in chair;with call bell/phone within reach;with chair alarm set Nurse Communication: Mobility status;Precautions PT Visit Diagnosis: Other abnormalities of gait and mobility (R26.89);Muscle  weakness (generalized) (M62.81);History of falling (Z91.81);Difficulty in walking, not elsewhere classified (R26.2)    Time: 4196-2229 PT Time Calculation (min) (ACUTE ONLY): 29 min   Charges:   PT Evaluation $PT Eval Low Complexity: 1 Low PT Treatments $Therapeutic Activity: 8-22 mins       Leitha Bleak, PT 12/17/18, 11:40 AM [phone]

## 2018-12-17 NOTE — Progress Notes (Signed)
[facility] [place] NAME: Lawrence Aguirre    MR#:  [id number]  DATE OF BIRTH:  12/06/1924  SUBJECTIVE:  SOB feels like LEE some improvement  REVIEW OF SYSTEMS:    Review of Systems  Constitutional: Negative for fever, chills weight loss HENT: Negative for ear pain, nosebleeds, congestion, facial swelling, rhinorrhea, neck pain, neck stiffness and ear discharge.   Respiratory: Negative for cough, ++ shortness of breath, wheezing  Cardiovascular: Negative for chest pain, palpitations and ++leg swelling.  Gastrointestinal: Negative for heartburn, abdominal pain, vomiting, diarrhea or consitpation Genitourinary: Negative for dysuria, urgency, frequency, hematuria Musculoskeletal: Negative for back pain or joint pain Neurological: Negative for dizziness, seizures, syncope, focal weakness,  numbness and headaches.  Hematological: Does not bruise/bleed easily.  Psychiatric/Behavioral: Negative for hallucinations, confusion, dysphoric mood    Tolerating Diet: yes      DRUG ALLERGIES:   Allergies  Allergen Reactions  . Iodine   . Nylon   . Penicillins   . Sulfa Antibiotics   . Tape     VITALS:  Blood pressure 129/68, pulse 70, temperature 97.9 F (36.6 C), resp. rate 19, height 6' (1.829 m), weight 91.7 kg, SpO2 95 %.  PHYSICAL EXAMINATION:  Constitutional: Appears well-developed and well-nourished. No distress. HENT: Normocephalic. Marland Kitchen Oropharynx is clear and moist.  Eyes: Conjunctivae and EOM are normal. PERRLA, no scleral icterus.  Neck: Normal ROM. Neck supple. No JVD. No tracheal deviation. CVS: RRR, S1/S2 +, 4/6 SEM no gallops, no carotid bruit.  Pulmonary: Effort and breath sounds normal, no stridor, rhonchi, wheezes, rales.  Abdominal: Soft. BS +,  no distension, tenderness, rebound or guarding.  Musculoskeletal: Normal range of motion.3+ LEEedema and no tenderness.  Neuro: Alert. CN 2-12 grossly intact. No focal  deficits. Skin: Skin is warm and dry. No rash noted. Psychiatric: Normal mood and affect.      LABORATORY PANEL:   CBC Recent Labs  Lab 12/15/18 1253  WBC 7.3  HGB 12.6*  HCT 38.1*  PLT 182   ------------------------------------------------------------------------------------------------------------------  Chemistries  Recent Labs  Lab 12/15/18 1253  12/17/18 0422  NA 131*   < > 130*  K 2.7*   < > 4.3  CL 92*   < > 93*  CO2 26   < > 25  GLUCOSE 107*   < > 118*  BUN 21   < > 34*  CREATININE 1.46*   < > 1.57*  CALCIUM 8.9   < > 8.9  MG 1.9  --   --   AST 22  --   --   ALT 13  --   --   ALKPHOS 77  --   --   BILITOT 0.9  --   --    < > = values in this interval not displayed.   ------------------------------------------------------------------------------------------------------------------  Cardiac Enzymes No results for input(s): TROPONINI in the last 168 hours. ------------------------------------------------------------------------------------------------------------------  RADIOLOGY:  Dg Chest 1 View  Result Date: 12/17/2018 CLINICAL DATA:  CHF exacerbation, hypertension, diabetes mellitus, pacemaker EXAM: CHEST  1 VIEW COMPARISON:  Portable exam 0831 hours compared to 12/15/2018 FINDINGS: LEFT subclavian sequential transvenous pacemaker leads project at RIGHT atrium and RIGHT ventricle. Enlargement of cardiac silhouette with pulmonary vascular congestion. Perihilar infiltrates consistent with pneumonia. Tortuous aorta. Small bibasilar effusions and atelectasis greater on RIGHT. No pneumothorax. Bones unremarkable. IMPRESSION: Persistent CHF with small bibasilar pleural effusions and atelectasis, little changed. Electronically Signed   By: Crist Infante.D.  On: 12/17/2018 08:43   Dg Chest Portable 1 View  Result Date: 12/15/2018 CLINICAL DATA:  85 year old male with a history of shortness of breath EXAM: PORTABLE CHEST 1 VIEW COMPARISON:  June 19, 2011  FINDINGS: Cardiomediastinal silhouette unchanged in size and contour. Low lung volumes with coarsened interstitial markings and interlobular septal thickening. Thickening of the minor fissure. Blunting of the bilateral costophrenic angles.  No pneumothorax. Unchanged position of the left chest wall pacing device. IMPRESSION: Evidence of pulmonary edema and bilateral pleural effusions, with atypical infection not excluded. Electronically Signed   By: Gilmer Mor D.O.   On: 12/15/2018 13:52     ASSESSMENT AND PLAN:   85 year old male with chronic combined systolic and diastolic heart failure with EF of 20%, severe aortic stenosis and status post pacemaker who presented to the emergency room due to generalized weakness and shortness of breath.  1.  Acute on chronic combined systolic and diastolic heart failure with ejection fraction of 20% in the setting of severe aortic stenosis: Need to continue with gentle diuresis as patient still symptomatic. Watch creatinine and Na level Suspect  shortness of breath is also from severe AS Continue low dose Metoprolol ACEI/ARB/Entreesto when able by BP and creatinine.  2.  Severe AS: Patient admitted with CHF exacerbation from underlying severe  AS This portends poor prognosis.  Patient will need outpatient palliative care services. . Not a candidate for AS surgery given age.  3.  Pacemaker status  4.  Hypokalemia: Replete PRN  5.  Acute on chronic kidney disease stage III in the setting of CHF exacerbation: Monitor closely while on Lasix. Low threshold for CRS.  6.  Elevated troponin due to demand ischemia: Patient ruled out for ACS. 7.  BPH: Continue Proscar 8.  Hyponatremia from CHF: Again poor prognostic indicator.  He will need outpatient palliative care services upon discharge.  Management plans discussed with the patient and he is in agreement.  CODE STATUS: partial  TOTAL TIME TAKING CARE OF THIS PATIENT: 25 minutes.     POSSIBLE  D/C 2-3 days, DEPENDING ON CLINICAL CONDITION.   Adrian Saran M.D on 12/17/2018 at 10:29 AM  Between 7am to 6pm - Pager - [phone] After 6pm go to [email or web address] - password [email or web address]  [facility]  Office  [phone]  CC: Primary care physician; Marina Goodell, MD  Note: This dictation was prepared with Dragon dictation along with smaller phrase technology. Any transcriptional errors that result from this process are unintentional.

## 2018-12-18 ENCOUNTER — Encounter: Payer: Self-pay | Admitting: *Deleted

## 2018-12-18 LAB — BASIC METABOLIC PANEL
Anion gap: 15 (ref 5–15)
BUN: 50 mg/dL — ABNORMAL HIGH (ref 8–23)
CO2: 22 mmol/L (ref 22–32)
Calcium: 9.1 mg/dL (ref 8.9–10.3)
Chloride: 93 mmol/L — ABNORMAL LOW (ref 98–111)
Creatinine, Ser: 1.63 mg/dL — ABNORMAL HIGH (ref 0.61–1.24)
GFR calc Af Amer: 42 mL/min — ABNORMAL LOW (ref >60)
GFR calc non Af Amer: 36 mL/min — ABNORMAL LOW (ref >60)
Glucose, Bld: 102 mg/dL — ABNORMAL HIGH (ref 70–99)
Potassium: 4.3 mmol/L (ref 3.5–5.1)
Sodium: 130 mmol/L — ABNORMAL LOW (ref 135–145)

## 2018-12-18 MED ORDER — POTASSIUM CHLORIDE CRYS ER 20 MEQ PO TBCR: 20.0000 meq | EXTENDED_RELEASE_TABLET | Freq: Every day | ORAL | 0 refills | Status: AC

## 2018-12-18 MED ORDER — METOPROLOL TARTRATE 25 MG PO TABS: 12.5000 mg | ORAL_TABLET | Freq: Two times a day (BID) | ORAL | 0 refills | Status: AC

## 2018-12-18 MED ORDER — POLYETHYLENE GLYCOL 3350 17 G PO PACK: 17.0000 g | PACK | Freq: Every day | ORAL | Status: DC

## 2018-12-18 MED ORDER — BISACODYL 10 MG RE SUPP: 10.0000 mg | Freq: Every day | RECTAL | Status: DC

## 2018-12-18 NOTE — Care Management Important Message (Signed)
Important Message  Patient Details  Name: Lawrence Aguirre. MRN: [id number] Date of Birth: 09/21/1924   Medicare Important Message Given:  Yes     Dannette Barbara 12/18/2018, 11:15 AM

## 2018-12-18 NOTE — Discharge Summary (Signed)
[facility] [place] [facility]   PATIENT NAME: Lawrence FannyJohn Aguirre    MR#:  [id number]  DATE OF BIRTH:  Jun 06, 1924  DATE OF ADMISSION:  12/15/2018 ADMITTING PHYSICIAN: Altamese DillingVaibhavkumar Vachhani, MD  DATE OF DISCHARGE: December 18, 2018  PRIMARY CARE PHYSICIAN: Marina GoodellFeldpausch, Dale E, MD    ADMISSION DIAGNOSIS:  Hypokalemia [E87.6] Elevated troponin [R79.89] Bilateral lower extremity edema [R60.0] Congestive heart failure, unspecified HF chronicity, unspecified heart failure type (HCC) [I50.9]  DISCHARGE DIAGNOSIS:  Principal Problem:   Acute on chronic systolic CHF (congestive heart failure) (HCC) Active Problems:   Acute on chronic systolic (congestive) heart failure (HCC)   SECONDARY DIAGNOSIS:   Past Medical History:  Diagnosis Date  . Aortic stenosis   . CHF (congestive heart failure) (HCC)    EF 35%, severe AS  . Diabetes mellitus without complication (HCC)   . Dyspnea   . Fracture of right tibial plateau   . H/O cardiac catheterization   . Hyperlipemia   . Hypertension   . Kidney disease   . Migraine headache   . Mitral regurgitation   . Peripheral edema   . Stenosis of right carotid artery     HOSPITAL COURSE:  85 year old male with chronic combined systolic and diastolic heart failure with EF of 20%, severe aortic stenosis and status post pacemaker who presented to the emergency room due to generalized weakness and shortness of breath.  1.  Acute on chronic combined systolic and diastolic heart failure with ejection fraction of 20% in the setting of severe aortic stenosis: Patient was diuresed with IV Lasix.  Due to severe aortic stenosis fluid balance is very difficult in this patient.   He will discharged on low-dose metoprolol, Lasix and ACE inhibitor.  He will follow-up with cardiology as an outpatient.     2.  Severe AS: Patient admitted with CHF exacerbation from underlying severe  AS This portends poor prognosis.  Patient will need outpatient  palliative care services. . Not a candidate for AS surgery given age. This was discussed thoroughly with patient's son.   3.  Pacemaker status  4.  Hypokalemia: Repleted PRN  5.  Acute on chronic kidney disease stage III in the setting of CHF exacerbation: Patient will need close follow-up to prevent cardiorenal syndrome.  6.  Elevated troponin due to demand ischemia: Patient ruled out for ACS. 7.  BPH: Continue Proscar 8.  Hyponatremia from CHF: Again poor prognostic indicator.  He will need outpatient palliative care services upon discharge.    DISCHARGE CONDITIONS AND DIET:  Stable Cardiac diet  CONSULTS OBTAINED:    DRUG ALLERGIES:   Allergies  Allergen Reactions  . Iodine   . Nylon   . Penicillins   . Sulfa Antibiotics   . Tape     DISCHARGE MEDICATIONS:   Allergies as of 12/18/2018      Reactions   Iodine    Nylon    Penicillins    Sulfa Antibiotics    Tape       Medication List    STOP taking these medications   spironolactone 50 MG tablet Commonly known as: ALDACTONE     TAKE these medications   amoxicillin-clavulanate 875-125 MG tablet Commonly known as: AUGMENTIN Take 1 tablet by mouth 2 (two) times daily.   aspirin EC 81 MG tablet Take 81 mg by mouth daily.   diphenhydrAMINE 25 mg capsule Commonly known as: BENADRYL Take 25 mg by mouth daily.   finasteride 5 MG tablet Commonly  known as: PROSCAR Take 5 mg by mouth daily.   furosemide 40 MG tablet Commonly known as: LASIX Take 80 mg by mouth daily.   ipratropium-albuterol 0.5-2.5 (3) MG/3ML Soln Commonly known as: DUONEB Inhale 3 mLs into the lungs every 6 (six) hours as needed.   lisinopril 5 MG tablet Commonly known as: ZESTRIL Take 5 mg by mouth 2 (two) times daily.   metoprolol tartrate 25 MG tablet Commonly known as: LOPRESSOR Take 0.5 tablets (12.5 mg total) by mouth 2 (two) times daily.   potassium chloride SA 20 MEQ tablet Commonly known as: K-DUR Take 1  tablet (20 mEq total) by mouth daily.   tamsulosin 0.4 MG Caps capsule Commonly known as: FLOMAX Take 0.8 mg by mouth at bedtime.         Today   CHIEF COMPLAINT:  Patient was constipated this morning however had a bowel movement   VITAL SIGNS:  Blood pressure 99/71, pulse 70, temperature 97.7 F (36.5 C), resp. rate 18, height 6' (1.829 m), weight 91.4 kg, SpO2 100 %.   REVIEW OF SYSTEMS:  Review of Systems  Constitutional: Negative.  Negative for chills, fever and malaise/fatigue.  HENT: Negative.  Negative for ear discharge, ear pain, hearing loss, nosebleeds and sore throat.   Eyes: Negative.  Negative for blurred vision and pain.  Respiratory: Positive for shortness of breath. Negative for cough, hemoptysis and wheezing.   Cardiovascular: Positive for leg swelling. Negative for chest pain and palpitations.  Gastrointestinal: Negative.  Negative for abdominal pain, blood in stool, diarrhea, nausea and vomiting.  Genitourinary: Negative.  Negative for dysuria.  Musculoskeletal: Negative.  Negative for back pain.  Skin: Negative.   Neurological: Negative for dizziness, tremors, speech change, focal weakness, seizures and headaches.  Endo/Heme/Allergies: Negative.  Does not bruise/bleed easily.  Psychiatric/Behavioral: Negative.  Negative for depression, hallucinations and suicidal ideas.     PHYSICAL EXAMINATION:  GENERAL:  85 y.o.-year-old patient lying patient lying in the bed with no acute distress.  NECK:  Supple, no jugular venous distention. No thyroid enlargement, no tenderness.  LUNGS: Normal breath sounds bilaterally, no wheezing, rales,rhonchi  No use of accessory muscles of respiration.  CARDIOVASCULAR: S1, S2 normal. 4/6 murmurs,  No rubs, or gallops.  ABDOMEN: Soft, non-tender, non-distended. Bowel sounds present. No organomegaly or mass.  EXTREMITIES: 3+ LEE no cyanosis, or clubbing.  PSYCHIATRIC: The patient is alert and oriented x 3.  SKIN: No obvious rash, lesion,  or ulcer.   DATA REVIEW:   CBC Recent Labs  Lab 12/15/18 1253  WBC 7.3  HGB 12.6*  HCT 38.1*  PLT 182    Chemistries  Recent Labs  Lab 12/15/18 1253  12/18/18 0424  NA 131*   < > 130*  K 2.7*   < > 4.3  CL 92*   < > 93*  CO2 26   < > 22  GLUCOSE 107*   < > 102*  BUN 21   < > 50*  CREATININE 1.46*   < > 1.63*  CALCIUM 8.9   < > 9.1  MG 1.9  --   --   AST 22  --   --   ALT 13  --   --   ALKPHOS 77  --   --   BILITOT 0.9  --   --    < > = values in this interval not displayed.    Cardiac Enzymes No results for input(s): TROPONINI in the last 168 hours.  Microbiology Results  @  RADIOLOGY:  Dg Chest 1 View  Result Date: 12/17/2018 CLINICAL DATA:  CHF exacerbation, hypertension, diabetes mellitus, pacemaker EXAM: CHEST  1 VIEW COMPARISON:  Portable exam 0831 hours compared to 12/15/2018 FINDINGS: LEFT subclavian sequential transvenous pacemaker leads project at RIGHT atrium and RIGHT ventricle. Enlargement of cardiac silhouette with pulmonary vascular congestion. Perihilar infiltrates consistent with pneumonia. Tortuous aorta. Small bibasilar effusions and atelectasis greater on RIGHT. No pneumothorax. Bones unremarkable. IMPRESSION: Persistent CHF with small bibasilar pleural effusions and atelectasis, little changed. Electronically Signed   By: Ulyses Southward M.D.   On: 12/17/2018 08:43      Allergies as of 12/18/2018      Reactions   Iodine    Nylon    Penicillins    Sulfa Antibiotics    Tape       Medication List    STOP taking these medications   spironolactone 50 MG tablet Commonly known as: ALDACTONE     TAKE these medications   amoxicillin-clavulanate 875-125 MG tablet Commonly known as: AUGMENTIN Take 1 tablet by mouth 2 (two) times daily.   aspirin EC 81 MG tablet Take 81 mg by mouth daily.   diphenhydrAMINE 25 mg capsule Commonly known as: BENADRYL Take 25 mg by mouth daily.   finasteride 5 MG tablet Commonly known as:  PROSCAR Take 5 mg by mouth daily.   furosemide 40 MG tablet Commonly known as: LASIX Take 80 mg by mouth daily.   ipratropium-albuterol 0.5-2.5 (3) MG/3ML Soln Commonly known as: DUONEB Inhale 3 mLs into the lungs every 6 (six) hours as needed.   lisinopril 5 MG tablet Commonly known as: ZESTRIL Take 5 mg by mouth 2 (two) times daily.   metoprolol tartrate 25 MG tablet Commonly known as: LOPRESSOR Take 0.5 tablets (12.5 mg total) by mouth 2 (two) times daily.   potassium chloride SA 20 MEQ tablet Commonly known as: K-DUR Take 1 tablet (20 mEq total) by mouth daily.   tamsulosin 0.4 MG Caps capsule Commonly known as: FLOMAX Take 0.8 mg by mouth at bedtime.          Management plans discussed with the patient and son and they are in agreement. Stable for discharge home with home health care and palliative care  Patient should follow up with PCP  CODE STATUS:     Code Status Orders  (From admission, onward)         Start     Ordered   12/15/18 1721  Limited resuscitation (code)  Continuous    Question Answer Comment  In the event of cardiac or respiratory ARREST: Initiate Code Blue, Call Rapid Response Yes   In the event of cardiac or respiratory ARREST: Perform CPR Yes   In the event of cardiac or respiratory ARREST: Perform Intubation/Mechanical Ventilation No   In the event of cardiac or respiratory ARREST: Use NIPPV/BiPAp only if indicated Yes   In the event of cardiac or respiratory ARREST: Administer ACLS medications if indicated Yes   In the event of cardiac or respiratory ARREST: Perform Defibrillation or Cardioversion if indicated Yes      12/15/18 1720        Code Status History    Date Active Date Inactive Code Status Order ID Comments User Context   12/15/2018 1609 12/15/2018 1720 DNR [id number]  Altamese Dilling, MD ED   Advance Care Planning Activity    Advance Directive Documentation     Most Recent Value  Type of Advance Directive   Living will  Pre-existing out of facility DNR order (yellow form or pink MOST form)  -  "MOST" Form in Place?  -      TOTAL TIME TAKING CARE OF THIS PATIENT: 39 minutes.    Note: This dictation was prepared with Dragon dictation along with smaller phrase technology. Any transcriptional errors that result from this process are unintentional.  Bettey Costa M.D on 12/18/2018 at 11:36 AM  Between 7am to 6pm - Pager - [phone] After 6pm go to [email or web address] - password [email or web address]  Office  [phone]  CC: Primary care physician; Sofie Hartigan, MD

## 2018-12-18 NOTE — Plan of Care (Signed)
  Problem: Activity: Goal: Risk for activity intolerance will decrease Outcome: Progressing   Problem: Nutrition: Goal: Adequate nutrition will be maintained Outcome: Progressing   Problem: Coping: Goal: Level of anxiety will decrease Outcome: Progressing   Problem: Pain Managment: Goal: General experience of comfort will improve Outcome: Progressing Note: No complaints of pain   Problem: Safety: Goal: Ability to remain free from injury will improve Outcome: Progressing   Problem: Skin Integrity: Goal: Risk for impaired skin integrity will decrease Outcome: Progressing

## 2018-12-18 NOTE — TOC Transition Note (Addendum)
Transition of Care [facility]) - CM/SW Discharge Note   Patient Details  Name: Lawrence Aguirre. MRN: [id number] Date of Birth: 09-Sep-1924  Transition of Care [facility]) CM/SW Contact:  Elza Rafter, RN Phone Number: 12/18/2018, 2:19 PM   Clinical Narrative:   Patient discharging to home today.  Referral to [facility], [facility], [facility], [facility], [facility]-Cannot accept patient at this time.  Referral in to [facility] with [facility]; waiting on call back.    @[id number] -Referrals to [facility], [facility] and [facility].  None can accept patient for [facility] services.  Terri Craft TOC supervisor has been notified.    Santiago Glad with [facility] notified for out patient palliative referral.           Patient Goals and CMS Choice        Discharge Placement                       Discharge Plan and Services                                     Social Determinants of Health (SDOH) Interventions     Readmission Risk Interventions No flowsheet data found.

## 2018-12-18 NOTE — Progress Notes (Signed)
New referral for outpatient Palliative to follow at home received from [facility]. Plan is for discharge home today. Patient information given to referral. Flo Shanks BSN, RN, [place] [phone]

## 2018-12-25 ENCOUNTER — Telehealth: Payer: Self-pay | Admitting: Primary Care

## 2018-12-25 NOTE — Telephone Encounter (Signed)
Called patient to schedule the Palliative Consult and spoke with daughter Vickii Chafe, daughter stated that someone from [facility] came out on 12/21/18 to see patient.  I told daughter that I would contact [facility] to verify if they did admit patient to Hospice services and if they did that I would cancel the referral, she was in agreement with this.  She requested our number just in case she needed to contact us for anything.    I called [facility] and verified that they did admit this patient to Hospice services on 12/21/18.

## 2018-12-26 ENCOUNTER — Ambulatory Visit: Payer: Medicare Other | Admitting: Family

## 2019-03-22 DEATH — deceased

## 2021-04-26 IMAGING — DX DG CHEST 1V
1 series · 1 of 1 positions shown · non-contrast
Comparison: Portable exam 8746 hours compared to 12/15/2018

CLINICAL DATA: CHF exacerbation, hypertension, diabetes mellitus,
pacemaker

EXAM:
CHEST  1 VIEW

[chest ap]
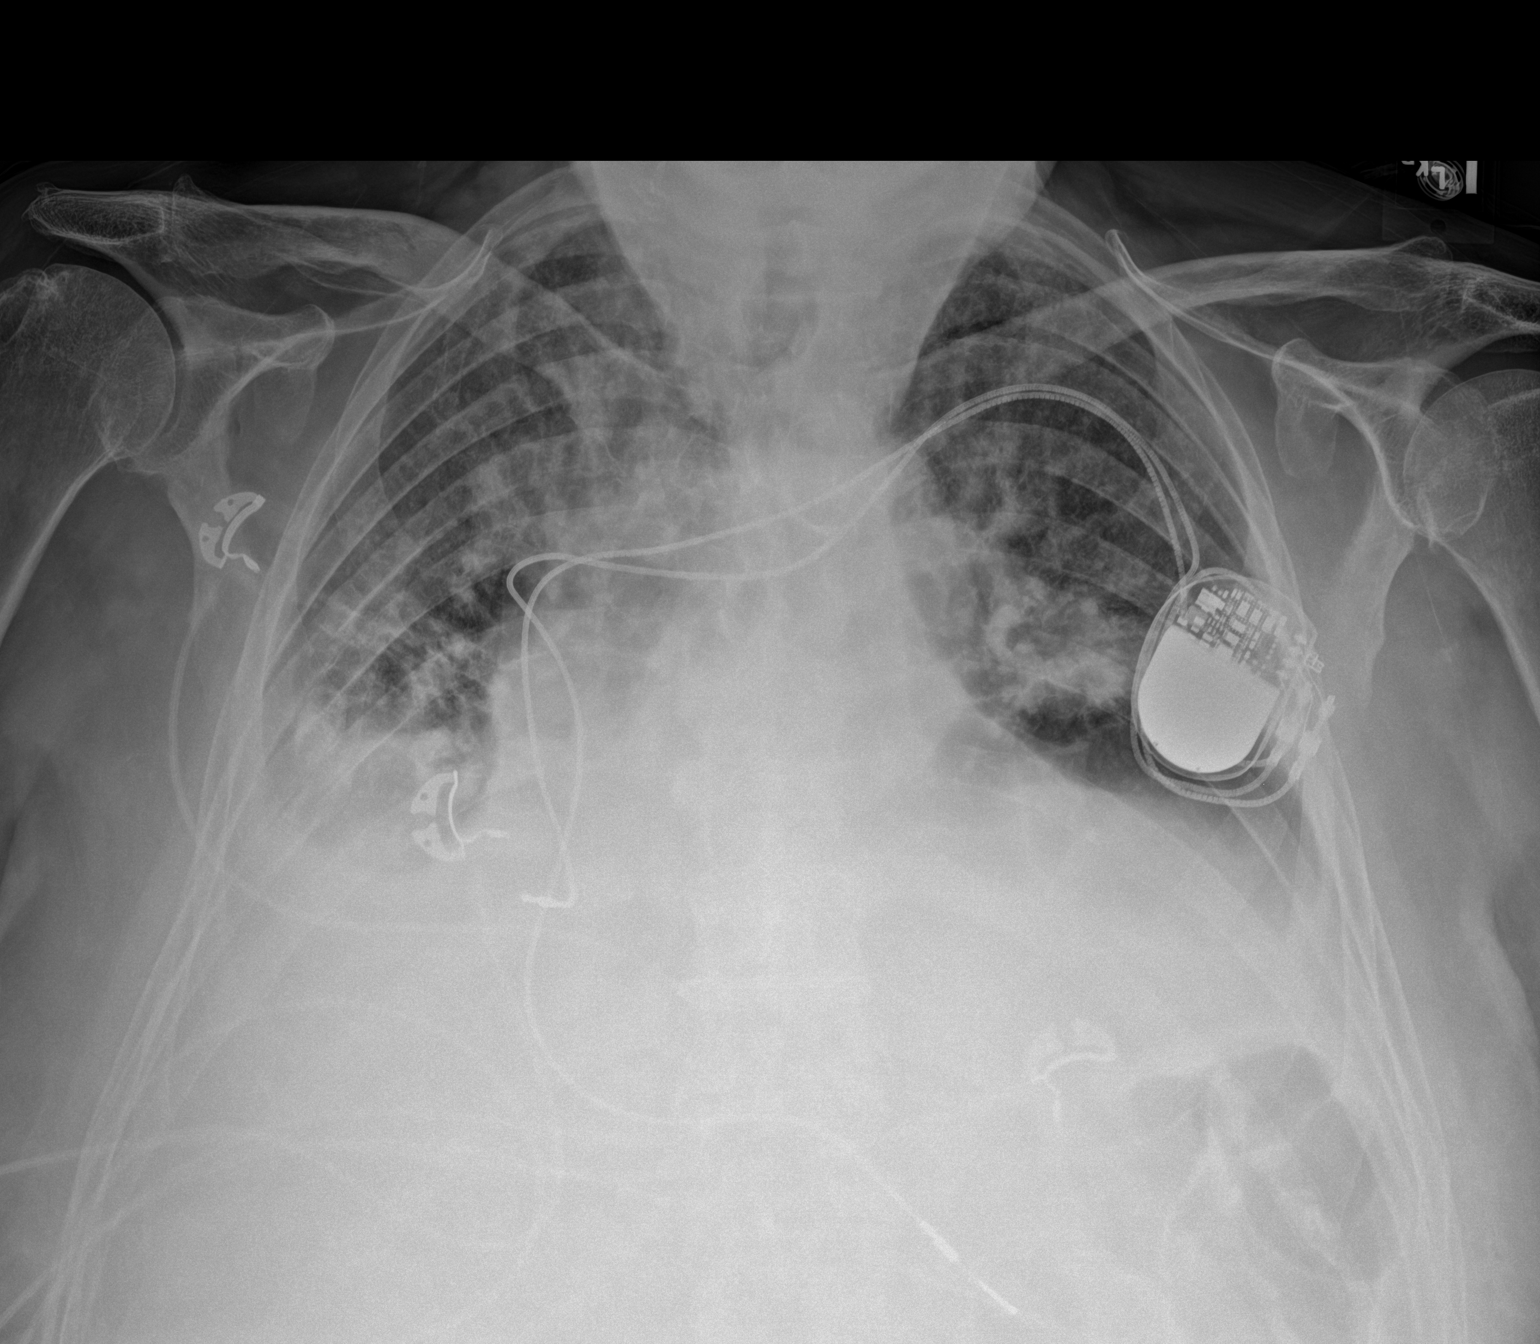

[1 of 1 positions shown; findings below may reference images not displayed]

FINDINGS: LEFT subclavian sequential transvenous pacemaker leads project at
RIGHT atrium and RIGHT ventricle.

Enlargement of cardiac silhouette with pulmonary vascular
congestion.

Perihilar infiltrates consistent with pneumonia.

Tortuous aorta.

Small bibasilar effusions and atelectasis greater on RIGHT.

No pneumothorax.

Bones unremarkable.
IMPRESSION: Persistent CHF with small bibasilar pleural effusions and
atelectasis, little changed.
